# Patient Record
Sex: Male | Born: 1940 | Race: Black or African American | Hispanic: No | Smoking: Light tobacco smoker
Health system: Southern US, Community
[De-identification: ages and names within clinical notes are randomized; demographics above are authoritative.]

## PROBLEM LIST (undated history)

## (undated) DIAGNOSIS — Z9001 Acquired absence of eye: Secondary | ICD-10-CM

## (undated) DIAGNOSIS — E119 Type 2 diabetes mellitus without complications: Secondary | ICD-10-CM

## (undated) DIAGNOSIS — F1010 Alcohol abuse, uncomplicated: Secondary | ICD-10-CM

## (undated) DIAGNOSIS — N289 Disorder of kidney and ureter, unspecified: Secondary | ICD-10-CM

## (undated) DIAGNOSIS — H548 Legal blindness, as defined in USA: Secondary | ICD-10-CM

---

## 1998-11-10 ENCOUNTER — Encounter: Payer: Self-pay | Admitting: Emergency Medicine

## 1998-11-10 ENCOUNTER — Emergency Department (HOSPITAL_COMMUNITY): Admission: EM | Admit: 1998-11-10 | Discharge: 1998-11-10 | Payer: Self-pay | Admitting: Emergency Medicine

## 1999-03-01 ENCOUNTER — Emergency Department (HOSPITAL_COMMUNITY): Admission: EM | Admit: 1999-03-01 | Discharge: 1999-03-01 | Payer: Self-pay | Admitting: Emergency Medicine

## 1999-03-02 ENCOUNTER — Encounter: Payer: Self-pay | Admitting: Emergency Medicine

## 1999-12-24 ENCOUNTER — Emergency Department (HOSPITAL_COMMUNITY): Admission: EM | Admit: 1999-12-24 | Discharge: 1999-12-25 | Payer: Self-pay | Admitting: Emergency Medicine

## 1999-12-24 ENCOUNTER — Encounter: Admission: RE | Admit: 1999-12-24 | Discharge: 1999-12-24 | Payer: Self-pay | Admitting: Internal Medicine

## 2000-03-14 ENCOUNTER — Emergency Department (HOSPITAL_COMMUNITY): Admission: EM | Admit: 2000-03-14 | Discharge: 2000-03-15 | Payer: Self-pay | Admitting: Emergency Medicine

## 2000-03-15 ENCOUNTER — Encounter: Payer: Self-pay | Admitting: Emergency Medicine

## 2000-08-05 ENCOUNTER — Encounter: Payer: Self-pay | Admitting: Emergency Medicine

## 2000-08-05 ENCOUNTER — Emergency Department (HOSPITAL_COMMUNITY): Admission: EM | Admit: 2000-08-05 | Discharge: 2000-08-05 | Payer: Self-pay | Admitting: *Deleted

## 2000-08-14 ENCOUNTER — Emergency Department (HOSPITAL_COMMUNITY): Admission: EM | Admit: 2000-08-14 | Discharge: 2000-08-15 | Payer: Self-pay

## 2000-08-15 ENCOUNTER — Encounter: Payer: Self-pay | Admitting: Emergency Medicine

## 2000-08-16 ENCOUNTER — Encounter: Admission: RE | Admit: 2000-08-16 | Discharge: 2000-08-16 | Payer: Self-pay | Admitting: Internal Medicine

## 2000-08-16 ENCOUNTER — Emergency Department (HOSPITAL_COMMUNITY): Admission: EM | Admit: 2000-08-16 | Discharge: 2000-08-16 | Payer: Self-pay | Admitting: Emergency Medicine

## 2001-03-17 ENCOUNTER — Encounter: Payer: Self-pay | Admitting: *Deleted

## 2001-03-17 ENCOUNTER — Emergency Department (HOSPITAL_COMMUNITY): Admission: EM | Admit: 2001-03-17 | Discharge: 2001-03-17 | Payer: Self-pay | Admitting: *Deleted

## 2003-01-30 ENCOUNTER — Emergency Department (HOSPITAL_COMMUNITY): Admission: EM | Admit: 2003-01-30 | Discharge: 2003-01-31 | Payer: Self-pay | Admitting: Emergency Medicine

## 2003-01-31 ENCOUNTER — Encounter: Payer: Self-pay | Admitting: Emergency Medicine

## 2003-08-19 ENCOUNTER — Emergency Department (HOSPITAL_COMMUNITY): Admission: EM | Admit: 2003-08-19 | Discharge: 2003-08-20 | Payer: Self-pay | Admitting: Emergency Medicine

## 2003-11-06 ENCOUNTER — Emergency Department (HOSPITAL_COMMUNITY): Admission: EM | Admit: 2003-11-06 | Discharge: 2003-11-06 | Payer: Self-pay | Admitting: Emergency Medicine

## 2004-01-05 ENCOUNTER — Emergency Department (HOSPITAL_COMMUNITY): Admission: EM | Admit: 2004-01-05 | Discharge: 2004-01-06 | Payer: Self-pay

## 2004-05-12 ENCOUNTER — Emergency Department (HOSPITAL_COMMUNITY): Admission: EM | Admit: 2004-05-12 | Discharge: 2004-05-13 | Payer: Self-pay

## 2005-02-06 ENCOUNTER — Emergency Department (HOSPITAL_COMMUNITY): Admission: EM | Admit: 2005-02-06 | Discharge: 2005-02-06 | Payer: Self-pay | Admitting: Emergency Medicine

## 2005-04-29 ENCOUNTER — Emergency Department (HOSPITAL_COMMUNITY): Admission: EM | Admit: 2005-04-29 | Discharge: 2005-04-30 | Payer: Self-pay | Admitting: Emergency Medicine

## 2006-07-24 ENCOUNTER — Inpatient Hospital Stay (HOSPITAL_COMMUNITY): Admission: EM | Admit: 2006-07-24 | Discharge: 2006-09-05 | Payer: Self-pay | Admitting: Emergency Medicine

## 2006-08-03 ENCOUNTER — Ambulatory Visit: Payer: Self-pay | Admitting: Vascular Surgery

## 2006-08-21 ENCOUNTER — Ambulatory Visit: Payer: Self-pay | Admitting: Physical Medicine & Rehabilitation

## 2006-12-19 ENCOUNTER — Ambulatory Visit: Payer: Self-pay | Admitting: Internal Medicine

## 2006-12-21 ENCOUNTER — Encounter (INDEPENDENT_AMBULATORY_CARE_PROVIDER_SITE_OTHER): Payer: Self-pay | Admitting: Internal Medicine

## 2007-01-02 ENCOUNTER — Ambulatory Visit: Payer: Self-pay | Admitting: Internal Medicine

## 2007-03-23 ENCOUNTER — Encounter (INDEPENDENT_AMBULATORY_CARE_PROVIDER_SITE_OTHER): Payer: Self-pay | Admitting: Internal Medicine

## 2007-03-23 DIAGNOSIS — I10 Essential (primary) hypertension: Secondary | ICD-10-CM | POA: Insufficient documentation

## 2007-03-23 DIAGNOSIS — E119 Type 2 diabetes mellitus without complications: Secondary | ICD-10-CM | POA: Insufficient documentation

## 2007-03-23 DIAGNOSIS — H4089 Other specified glaucoma: Secondary | ICD-10-CM

## 2007-03-23 DIAGNOSIS — H1089 Other conjunctivitis: Secondary | ICD-10-CM

## 2007-03-23 DIAGNOSIS — K219 Gastro-esophageal reflux disease without esophagitis: Secondary | ICD-10-CM

## 2007-03-23 DIAGNOSIS — R079 Chest pain, unspecified: Secondary | ICD-10-CM

## 2007-03-23 DIAGNOSIS — F1020 Alcohol dependence, uncomplicated: Secondary | ICD-10-CM

## 2007-03-23 DIAGNOSIS — R7309 Other abnormal glucose: Secondary | ICD-10-CM

## 2007-07-27 IMAGING — CR DG CHEST 1V PORT
1 series · 1 of 1 positions shown · non-contrast
Comparison: 08/18/06.

CLINICAL DATA: Contusion.  Follow-up.
 PORTABLE CHEST - 1 VIEW - 08/19/06 AT 1815 HOURS:

[view not recorded]
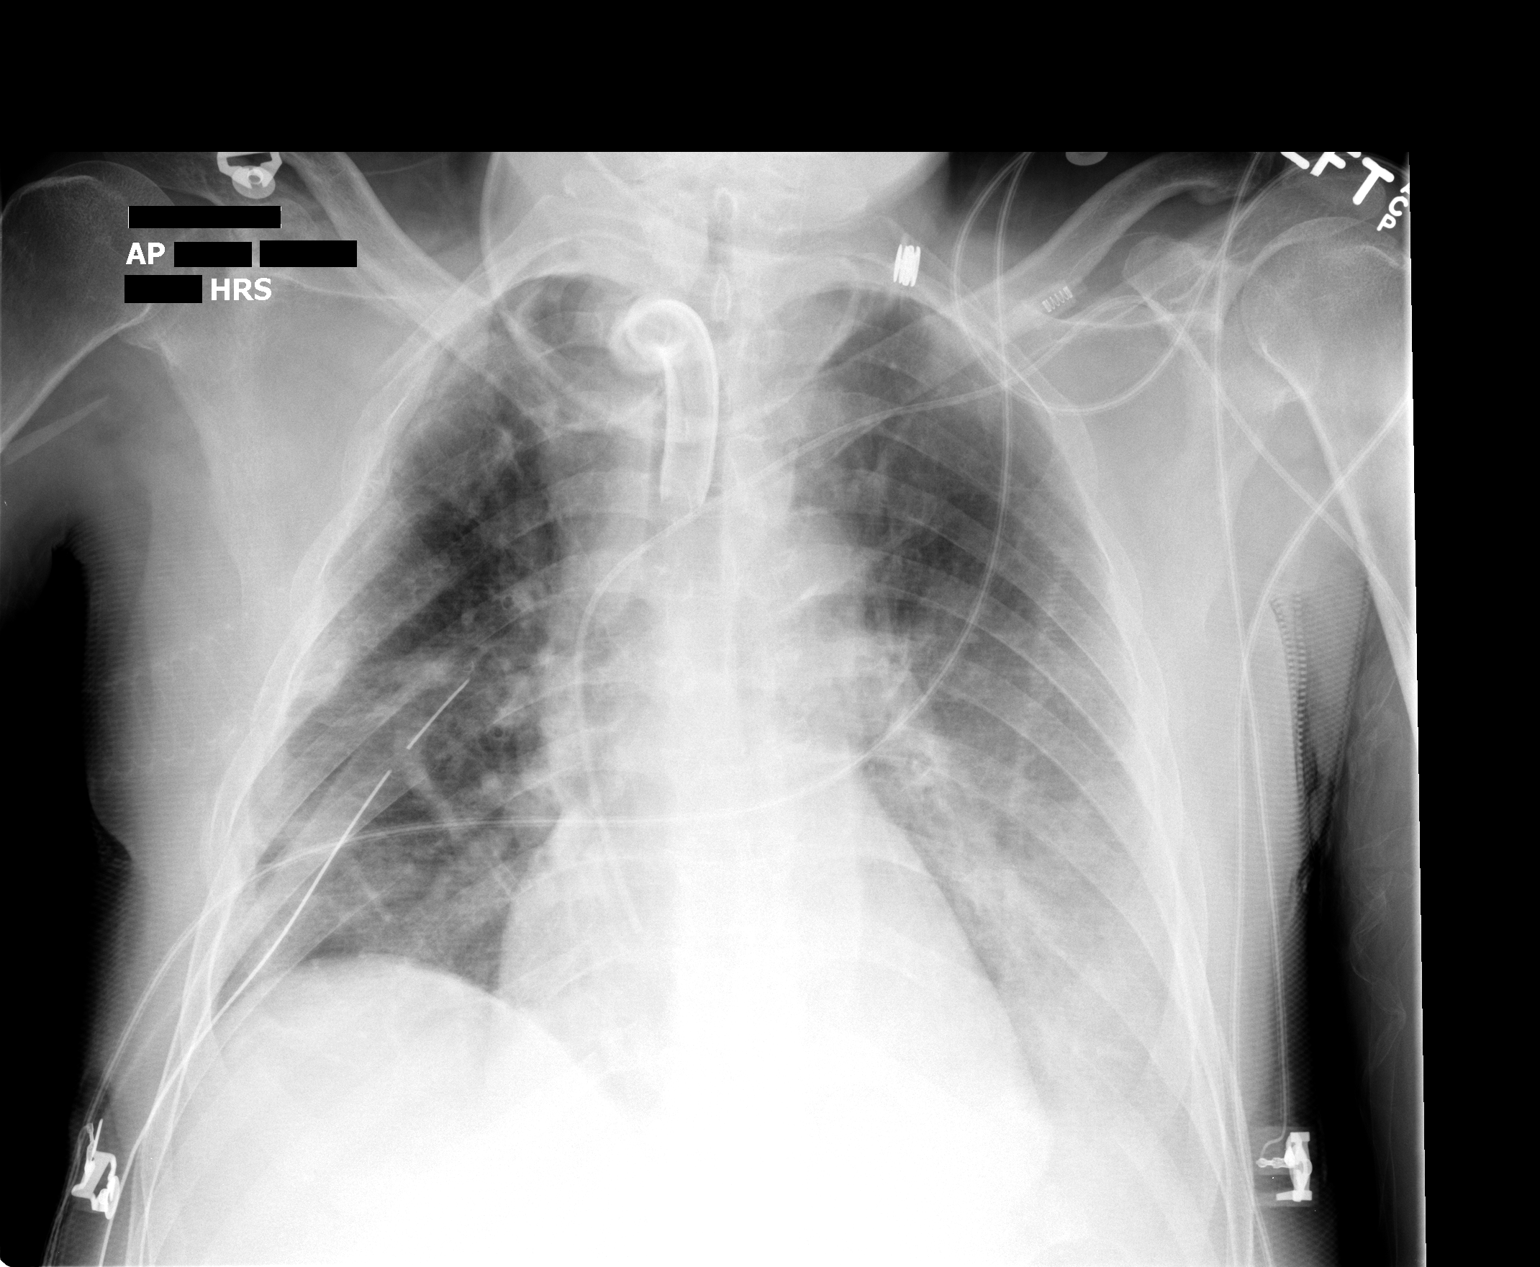

[1 of 1 positions shown; findings below may reference images not displayed]

FINDINGS: There is little change in haziness throughout the lungs, particularly at the left lung base, consistent with atelectasis, effusion, and possibly contusion.  There appear to be bilateral rib fractures.  A right chest tube is present and only a tiny apical pneumothorax is noted.  A tracheostomy tube remains and a left central venous line tip is in the SVC-RA junction.
IMPRESSION: 1.  Right chest tube remains with a tiny right apical pneumothorax.
 2.  No change in air space disease, particularly at the bases, left greater than right.

## 2007-07-28 IMAGING — CR DG CHEST 1V PORT
1 series · 1 of 1 positions shown · non-contrast
Comparison: none

CLINICAL DATA: Contusion.  Follow-up.
 PORTABLE CHEST - 1 VIEW 08/20/06 AT 7666 HOURS:

[view not recorded]
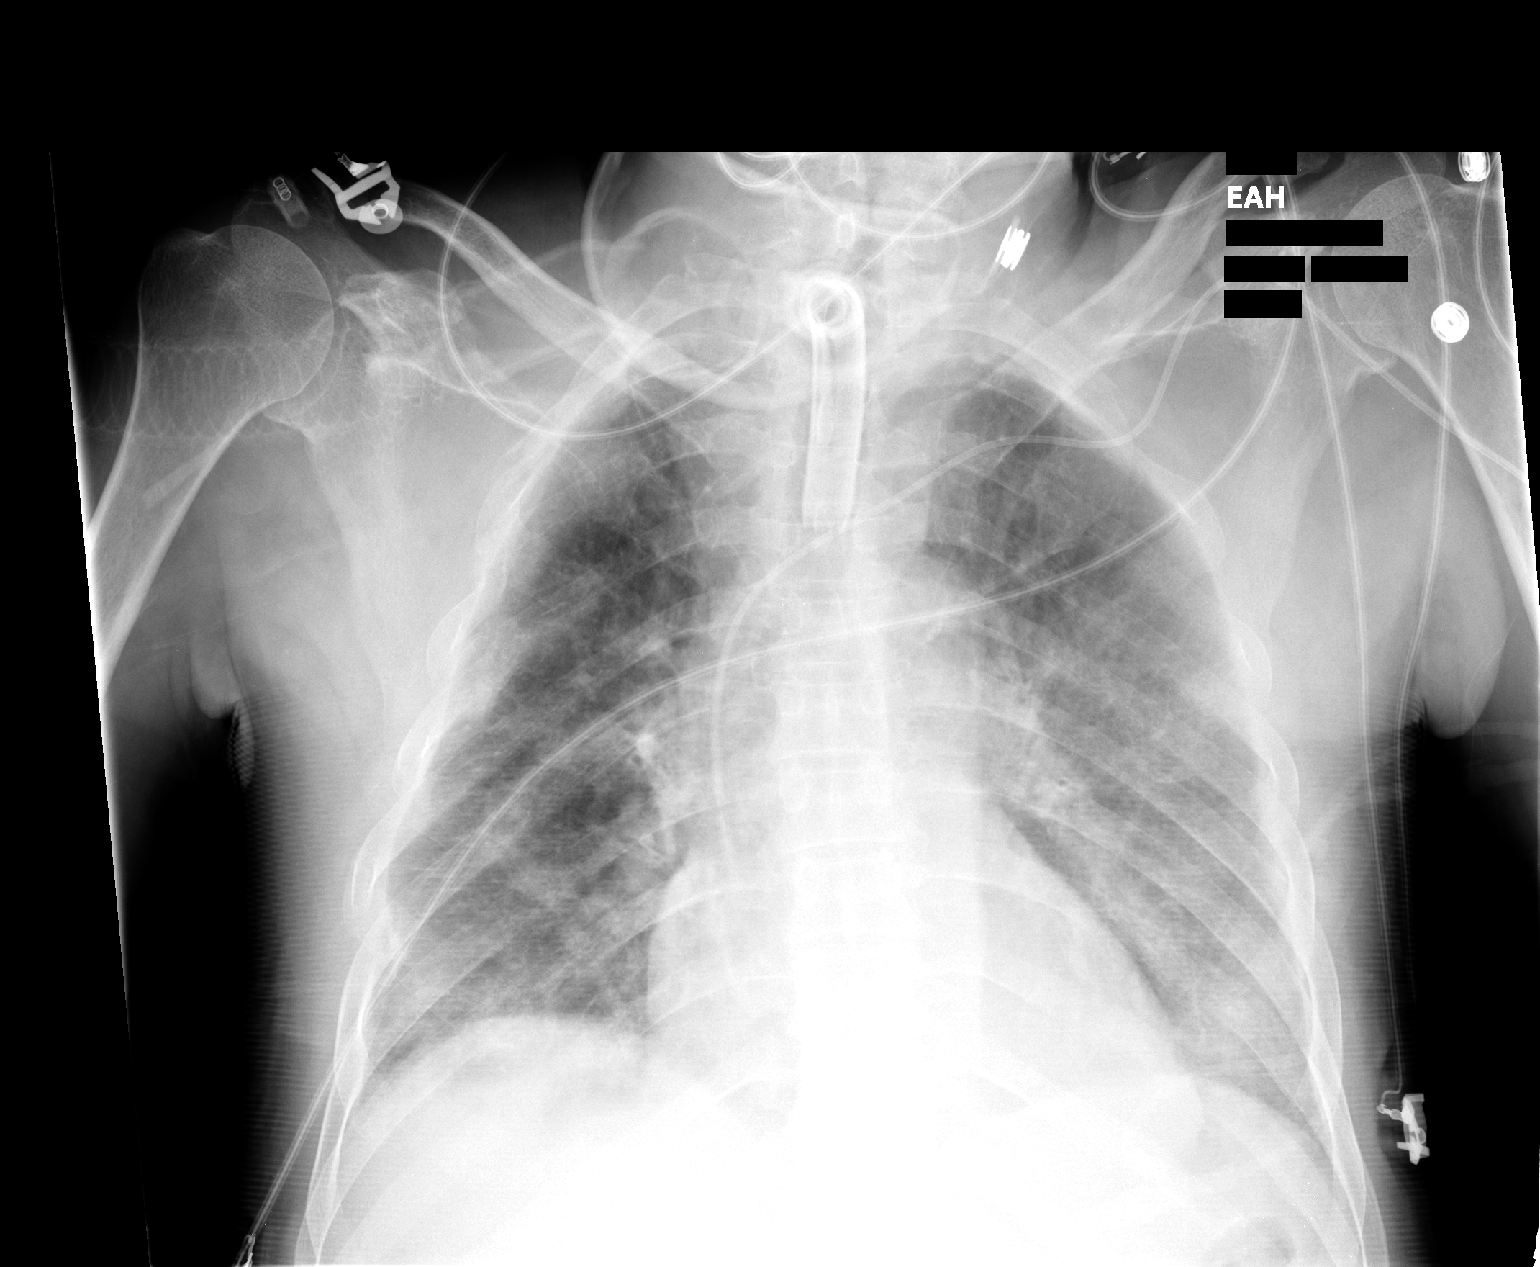

[1 of 1 positions shown; findings below may reference images not displayed]

FINDINGS: The right chest tube has been removed and the tiny right apical pneumothorax noted yesterday is not seen.  Coarse lung markings remain bilaterally.  Tracheostomy and central venous line remain.
IMPRESSION: Right chest tube removed.  No definite pneumothorax.

## 2007-07-31 IMAGING — CR DG CHEST 1V PORT
1 series · 1 of 1 positions shown · non-contrast
Comparison: 08/22/06.

CLINICAL DATA: Head contusion, ankle fracture.
 PORTABLE CHEST ? 1 VIEW ? 08/23/06:

[view not recorded]
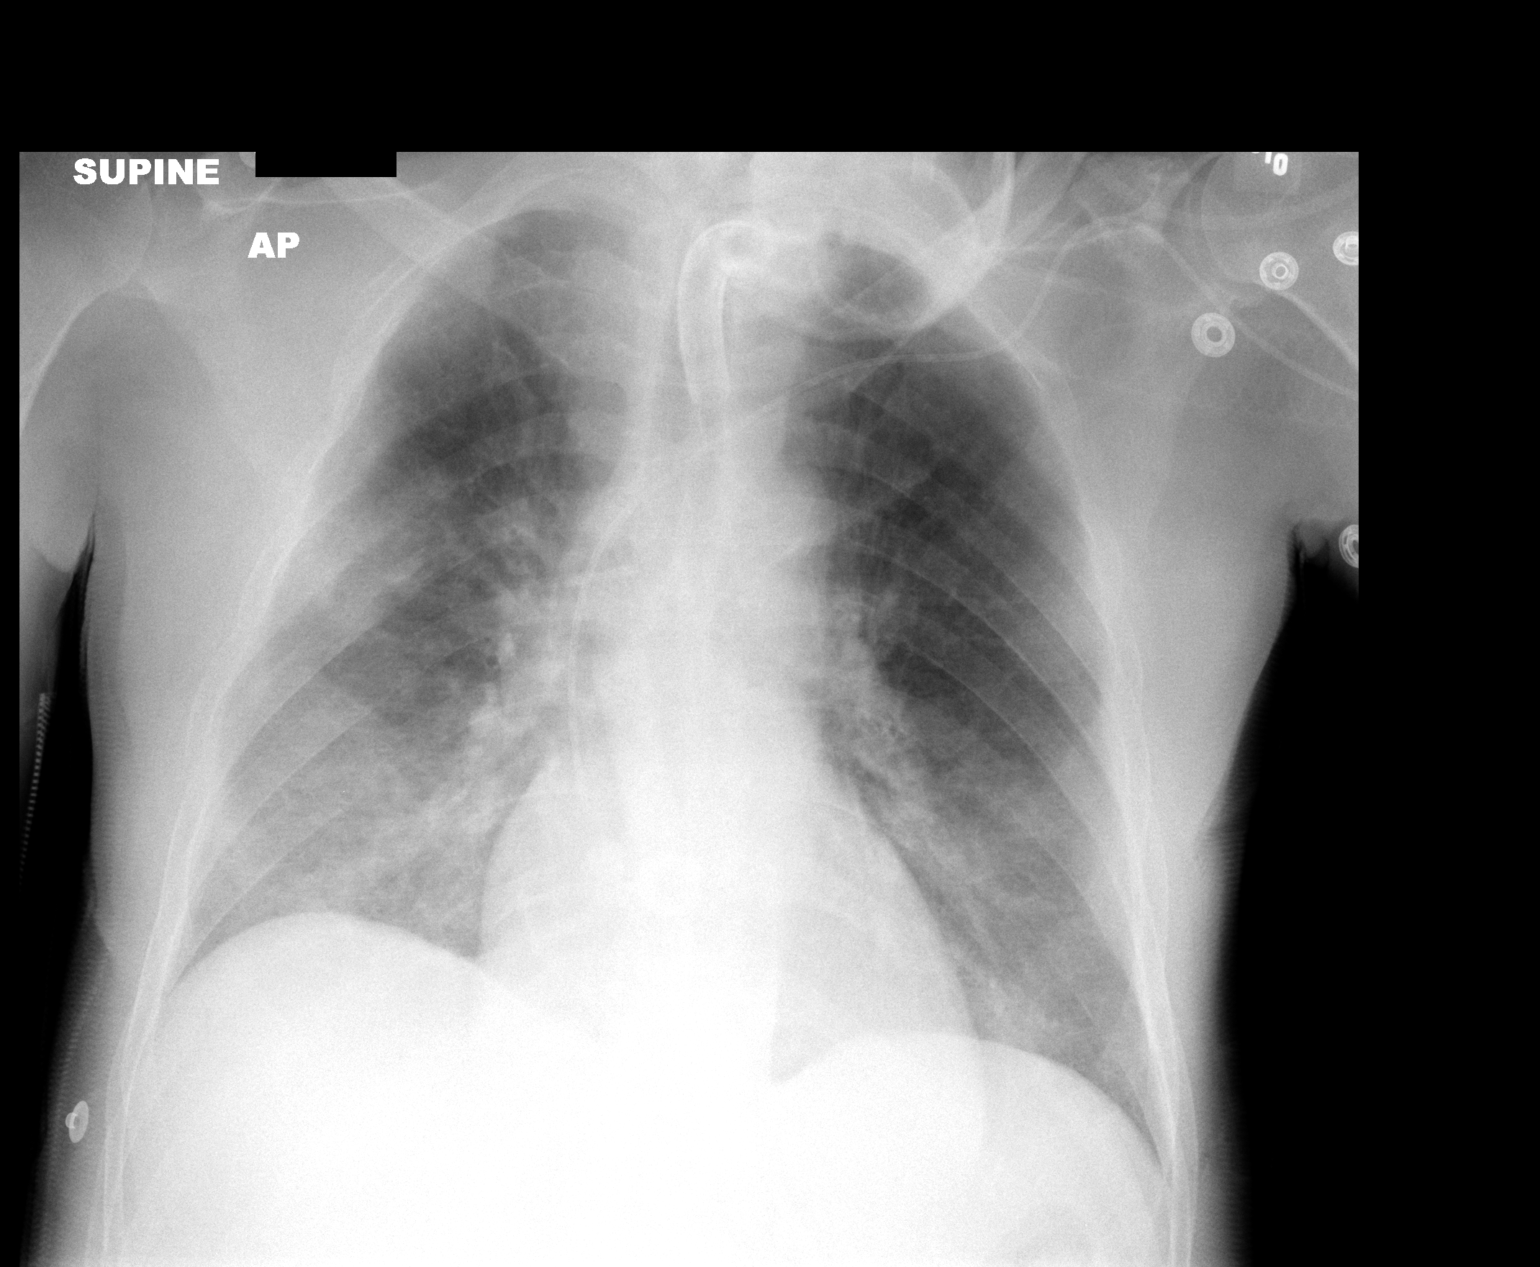

[1 of 1 positions shown; findings below may reference images not displayed]

FINDINGS: Tracheostomy is midline.  Left-sided PICC line terminates at the low SVC.  The heart size is normal.  Suspect COPD.  Costophrenic angles are sharp.  There is no pneumothorax.  Slight improvement in right greater than left patchy airspace opacities.  Mild osteopenia.
IMPRESSION: Improvement in patchy right greater than left airspace opacities most likely related to pulmonary edema.  Multifocal infection or aspiration felt less likely.

## 2007-08-01 IMAGING — CR DG CHEST 1V PORT
1 series · 1 of 1 positions shown · non-contrast
Comparison: 08/23/06.

CLINICAL DATA: Rib fractures.  Worsening cough.  
 PORTABLE CHEST ? 1 VIEW:

[view not recorded]
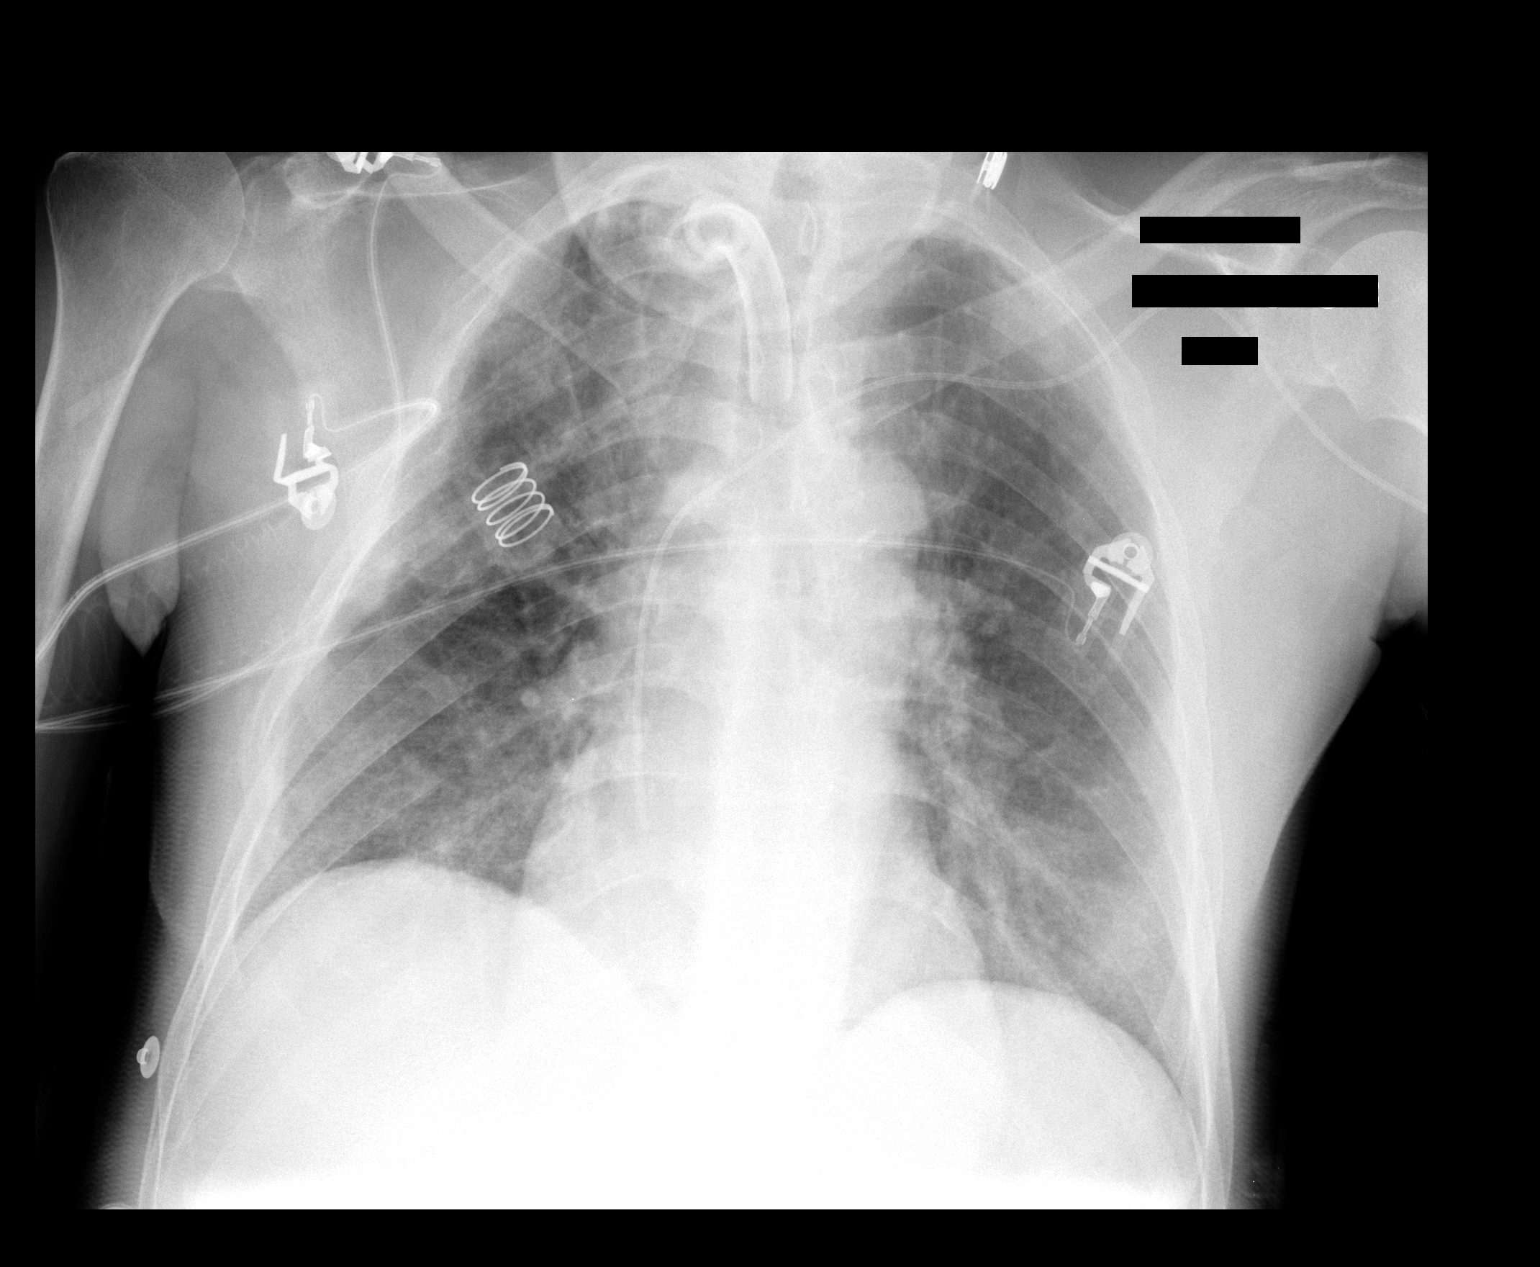

[1 of 1 positions shown; findings below may reference images not displayed]

FINDINGS: Mild patchy pulmonary opacity in the right upper lobe and left lung base has not significantly changed.  No new or worsening areas of pulmonary opacity are seen.  There is no evidence of pleural effusion.  Heart size is normal.  Right rib fracture deformities are again noted but there is no evidence of pneumothorax.  Tracheostomy tube and left arm PICC line remain in appropriate position.
IMPRESSION: No significant change compared with prior study.  No evidence of pneumothorax.
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
## 2009-05-17 ENCOUNTER — Emergency Department (HOSPITAL_COMMUNITY): Admission: EM | Admit: 2009-05-17 | Discharge: 2009-05-17 | Payer: Self-pay | Admitting: Emergency Medicine

## 2009-11-18 ENCOUNTER — Emergency Department (HOSPITAL_COMMUNITY): Admission: EM | Admit: 2009-11-18 | Discharge: 2009-11-19 | Payer: Self-pay | Admitting: Emergency Medicine

## 2010-05-21 ENCOUNTER — Emergency Department (HOSPITAL_COMMUNITY): Admission: EM | Admit: 2010-05-21 | Discharge: 2010-05-21 | Payer: Self-pay | Admitting: Emergency Medicine

## 2010-07-18 ENCOUNTER — Encounter: Payer: Self-pay | Admitting: Neurosurgery

## 2010-09-13 LAB — CBC
MCHC: 33.8 g/dL (ref 30.0–36.0)
Platelets: 214 10*3/uL (ref 150–400)
RBC: 3.89 MIL/uL — ABNORMAL LOW (ref 4.22–5.81)
RDW: 15 % (ref 11.5–15.5)

## 2010-09-13 LAB — RAPID URINE DRUG SCREEN, HOSP PERFORMED
Amphetamines: NOT DETECTED
Benzodiazepines: NOT DETECTED
Opiates: POSITIVE — AB
Tetrahydrocannabinol: NOT DETECTED

## 2010-09-13 LAB — POCT I-STAT, CHEM 8
HCT: 45 % (ref 39.0–52.0)
Hemoglobin: 15.3 g/dL (ref 13.0–17.0)
Potassium: 3.9 mEq/L (ref 3.5–5.1)
Sodium: 138 mEq/L (ref 135–145)

## 2010-09-13 LAB — DIFFERENTIAL
Basophils Absolute: 0.1 10*3/uL (ref 0.0–0.1)
Basophils Relative: 1 % (ref 0–1)
Monocytes Relative: 6 % (ref 3–12)
Neutro Abs: 5.8 10*3/uL (ref 1.7–7.7)
Neutrophils Relative %: 73 % (ref 43–77)
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
## 2010-11-12 NOTE — Op Note (Signed)
[name], [name] NO.:  [id number]   MEDICAL RECORD NO.:  [id number]          PATIENT TYPE:  EMS   LOCATION:  MAJO                         FACILITY:  [facility]   PHYSICIAN:  Earney Hamburg, P.A.  DATE OF BIRTH:  18-Sep-1940   DATE OF PROCEDURE:  07/24/2006  DATE OF DISCHARGE:                               OPERATIVE REPORT   PROCEDURE:  Complex closure of occipital scalp laceration.   INDICATIONS:  Occipital scalp laceration.   SURGEON:  Earney Hamburg, P.A.   ASSISTANT:  None.   ANESTHESIA:  Approximately 5 mL of 2% lidocaine with epinephrine.   COMPLICATIONS:  None.   ESTIMATED BLOOD LOSS:  None.   FINDINGS:  After the patient was anesthetized, the wound was irrigated  with approximately 100 mL of normal saline and scrubbed with Betadine  scrubs.  He was noted to have a couple flaps that looked viable, but  certainly have the potential to necrose.  There seemed to be some mild  tissue defect in the middle of the wound.  He was approximated as best  as able with a sterile stapler, which resulted in an adequate closure  with adequate cosmesis.  The patient tolerated the procedure well.      Earney Hamburg, P.A.     MJ/MEDQ  D:  07/24/2006  T:  07/24/2006  Job:  [id number]
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
## 2010-11-12 NOTE — Op Note (Signed)
[place]  Patient:    [name], [name]                         MRN: [id number] Proc. Date: 08/16/00 Attending:  Chalmers Guest, Montez Hageman., M.D.                           Operative Report  PREOPERATIVE DIAGNOSIS:  Subacute angle closure glaucoma.  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  OPERATION PERFORMED:  Laser iridotomy.  SURGEON: Chalmers Guest, Montez Hageman., M.D.  COMPLICATIONS:  None.  ANESTHESIA:  DESCRIPTION OF PROCEDURE:  The patient was taken to the short stay area where his eye was numbed with topical tetracaine drops.  With his eye in position, the YAG laser was used to deliver 5.5 millijoules of energy with one shot. The location was the superior peripheral portion of the iris of the right eye. After the iridotomy, aqueous could be seen egressing through the open iridotomy passing into the anterior chamber.  The anterior chamber depth, however, was unchanged at the slit lamp immediately following the laser. DD:  08/17/00 TD:  08/17/00 Job: [id number]
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
## 2011-01-04 ENCOUNTER — Emergency Department (HOSPITAL_COMMUNITY): Payer: Medicare Other

## 2011-01-04 ENCOUNTER — Emergency Department (HOSPITAL_COMMUNITY)
Admission: EM | Admit: 2011-01-04 | Discharge: 2011-01-04 | Disposition: A | Discharge status: Home / Self Care | Payer: Medicare Other | Attending: Emergency Medicine | Admitting: Emergency Medicine

## 2011-01-04 DIAGNOSIS — F17200 Nicotine dependence, unspecified, uncomplicated: Secondary | ICD-10-CM | POA: Insufficient documentation

## 2011-01-04 DIAGNOSIS — F1010 Alcohol abuse, uncomplicated: Secondary | ICD-10-CM | POA: Insufficient documentation

## 2011-01-04 DIAGNOSIS — G9389 Other specified disorders of brain: Secondary | ICD-10-CM | POA: Insufficient documentation

## 2011-01-04 DIAGNOSIS — Z9001 Acquired absence of eye: Secondary | ICD-10-CM | POA: Insufficient documentation

## 2011-01-04 DIAGNOSIS — N390 Urinary tract infection, site not specified: Secondary | ICD-10-CM | POA: Insufficient documentation

## 2011-01-04 DIAGNOSIS — R5381 Other malaise: Secondary | ICD-10-CM | POA: Insufficient documentation

## 2011-01-04 DIAGNOSIS — H543 Unqualified visual loss, both eyes: Secondary | ICD-10-CM | POA: Insufficient documentation

## 2011-01-04 DIAGNOSIS — R5383 Other fatigue: Secondary | ICD-10-CM | POA: Insufficient documentation

## 2011-01-04 DIAGNOSIS — R110 Nausea: Secondary | ICD-10-CM | POA: Insufficient documentation

## 2011-01-04 LAB — COMPREHENSIVE METABOLIC PANEL
ALT: 13 U/L (ref 0–53)
Albumin: 3.9 g/dL (ref 3.5–5.2)
Alkaline Phosphatase: 91 U/L (ref 39–117)
BUN: 28 mg/dL — ABNORMAL HIGH (ref 6–23)
Potassium: 4.3 mEq/L (ref 3.5–5.1)
Sodium: 139 mEq/L (ref 135–145)
Total Protein: 8.1 g/dL (ref 6.0–8.3)

## 2011-01-04 LAB — CK TOTAL AND CKMB (NOT AT ARMC)
CK, MB: 1.6 ng/mL (ref 0.3–4.0)
Relative Index: INVALID (ref 0.0–2.5)

## 2011-01-04 LAB — URINE MICROSCOPIC-ADD ON

## 2011-01-04 LAB — CBC
HCT: 40.6 % (ref 39.0–52.0)
Hemoglobin: 13.8 g/dL (ref 13.0–17.0)
WBC: 8.9 10*3/uL (ref 4.0–10.5)

## 2011-01-04 LAB — URINALYSIS, ROUTINE W REFLEX MICROSCOPIC
Glucose, UA: NEGATIVE mg/dL
Specific Gravity, Urine: 1.021 (ref 1.005–1.030)
pH: 5 (ref 5.0–8.0)

## 2011-01-04 LAB — DIFFERENTIAL
Basophils Absolute: 0 10*3/uL (ref 0.0–0.1)
Lymphocytes Relative: 23 % (ref 12–46)
Neutro Abs: 6 10*3/uL (ref 1.7–7.7)
Neutrophils Relative %: 68 % (ref 43–77)

## 2011-01-19 ENCOUNTER — Emergency Department (HOSPITAL_COMMUNITY): Payer: Medicare Other

## 2011-01-19 ENCOUNTER — Inpatient Hospital Stay (HOSPITAL_COMMUNITY)
Admission: EM | Admit: 2011-01-19 | Discharge: 2011-01-21 | DRG: 683 | Disposition: A | Discharge status: Home / Self Care | Payer: Medicare Other | Attending: Internal Medicine | Admitting: Internal Medicine

## 2011-01-19 ENCOUNTER — Inpatient Hospital Stay (HOSPITAL_COMMUNITY): Payer: Medicare Other

## 2011-01-19 ENCOUNTER — Inpatient Hospital Stay: Admission: EM | Admit: 2011-01-19 | Payer: Self-pay | Admitting: *Deleted

## 2011-01-19 DIAGNOSIS — Z7982 Long term (current) use of aspirin: Secondary | ICD-10-CM

## 2011-01-19 DIAGNOSIS — E860 Dehydration: Secondary | ICD-10-CM | POA: Diagnosis present

## 2011-01-19 DIAGNOSIS — E872 Acidosis, unspecified: Secondary | ICD-10-CM | POA: Diagnosis present

## 2011-01-19 DIAGNOSIS — H209 Unspecified iridocyclitis: Secondary | ICD-10-CM | POA: Diagnosis present

## 2011-01-19 DIAGNOSIS — H543 Unqualified visual loss, both eyes: Secondary | ICD-10-CM | POA: Diagnosis present

## 2011-01-19 DIAGNOSIS — Z87891 Personal history of nicotine dependence: Secondary | ICD-10-CM

## 2011-01-19 DIAGNOSIS — E875 Hyperkalemia: Secondary | ICD-10-CM | POA: Diagnosis present

## 2011-01-19 DIAGNOSIS — N179 Acute kidney failure, unspecified: Principal | ICD-10-CM | POA: Diagnosis present

## 2011-01-19 DIAGNOSIS — H409 Unspecified glaucoma: Secondary | ICD-10-CM | POA: Diagnosis present

## 2011-01-19 DIAGNOSIS — I10 Essential (primary) hypertension: Secondary | ICD-10-CM | POA: Diagnosis present

## 2011-01-19 DIAGNOSIS — N390 Urinary tract infection, site not specified: Secondary | ICD-10-CM | POA: Diagnosis present

## 2011-01-19 LAB — URINALYSIS, ROUTINE W REFLEX MICROSCOPIC
Bilirubin Urine: NEGATIVE
Glucose, UA: NEGATIVE mg/dL
Protein, ur: NEGATIVE mg/dL
Urobilinogen, UA: 0.2 mg/dL (ref 0.0–1.0)

## 2011-01-19 LAB — BLOOD GAS, ARTERIAL
Acid-base deficit: 7.2 mmol/L — ABNORMAL HIGH (ref 0.0–2.0)
Bicarbonate: 17.8 mEq/L — ABNORMAL LOW (ref 20.0–24.0)
Patient temperature: 98.6
TCO2: 18.9 mmol/L (ref 0–100)
pH, Arterial: 7.316 — ABNORMAL LOW (ref 7.350–7.450)

## 2011-01-19 LAB — BASIC METABOLIC PANEL
BUN: 37 mg/dL — ABNORMAL HIGH (ref 6–23)
Creatinine, Ser: 2.81 mg/dL — ABNORMAL HIGH (ref 0.50–1.35)
GFR calc Af Amer: 27 mL/min — ABNORMAL LOW (ref >60)
GFR calc non Af Amer: 23 mL/min — ABNORMAL LOW (ref >60)
Glucose, Bld: 100 mg/dL — ABNORMAL HIGH (ref 70–99)

## 2011-01-19 LAB — RAPID URINE DRUG SCREEN, HOSP PERFORMED
Amphetamines: NOT DETECTED
Cocaine: NOT DETECTED
Opiates: NOT DETECTED
Tetrahydrocannabinol: NOT DETECTED

## 2011-01-19 LAB — URINE MICROSCOPIC-ADD ON

## 2011-01-19 LAB — DIFFERENTIAL
Basophils Absolute: 0 10*3/uL (ref 0.0–0.1)
Basophils Relative: 0 % (ref 0–1)
Eosinophils Absolute: 0.2 10*3/uL (ref 0.0–0.7)
Eosinophils Relative: 2 % (ref 0–5)
Lymphocytes Relative: 16 % (ref 12–46)

## 2011-01-19 LAB — CBC
MCHC: 35.2 g/dL (ref 30.0–36.0)
Platelets: 237 10*3/uL (ref 150–400)
RDW: 14.1 % (ref 11.5–15.5)
WBC: 9.5 10*3/uL (ref 4.0–10.5)

## 2011-01-19 LAB — CK TOTAL AND CKMB (NOT AT ARMC)
CK, MB: 2.3 ng/mL (ref 0.3–4.0)
Relative Index: INVALID (ref 0.0–2.5)

## 2011-01-19 LAB — APTT: aPTT: 29 seconds (ref 24–37)

## 2011-01-19 LAB — PROTIME-INR
INR: 0.97 (ref 0.00–1.49)
Prothrombin Time: 13.1 seconds (ref 11.6–15.2)

## 2011-01-20 ENCOUNTER — Other Ambulatory Visit: Payer: Self-pay | Admitting: Internal Medicine

## 2011-01-20 ENCOUNTER — Inpatient Hospital Stay (HOSPITAL_COMMUNITY): Payer: Medicare Other

## 2011-01-20 ENCOUNTER — Inpatient Hospital Stay (HOSPITAL_COMMUNITY): Payer: Medicare Other | Attending: Internal Medicine

## 2011-01-20 DIAGNOSIS — I517 Cardiomegaly: Secondary | ICD-10-CM

## 2011-01-20 DIAGNOSIS — M79609 Pain in unspecified limb: Secondary | ICD-10-CM

## 2011-01-20 LAB — COMPREHENSIVE METABOLIC PANEL
ALT: 18 U/L (ref 0–53)
Albumin: 3.3 g/dL — ABNORMAL LOW (ref 3.5–5.2)
Alkaline Phosphatase: 63 U/L (ref 39–117)
Potassium: 5 mEq/L (ref 3.5–5.1)
Sodium: 139 mEq/L (ref 135–145)
Total Protein: 6.6 g/dL (ref 6.0–8.3)

## 2011-01-20 LAB — URINALYSIS, ROUTINE W REFLEX MICROSCOPIC
Glucose, UA: NEGATIVE mg/dL
Hgb urine dipstick: NEGATIVE
Protein, ur: NEGATIVE mg/dL

## 2011-01-20 LAB — URINE CULTURE: Culture  Setup Time: 201207251742

## 2011-01-20 LAB — URINALYSIS, MICROSCOPIC ONLY
Bilirubin Urine: NEGATIVE
Hgb urine dipstick: NEGATIVE
Ketones, ur: NEGATIVE mg/dL
Nitrite: NEGATIVE
pH: 5 (ref 5.0–8.0)

## 2011-01-20 LAB — LIPID PANEL
Total CHOL/HDL Ratio: 2.8 RATIO
VLDL: 29 mg/dL (ref 0–40)

## 2011-01-20 LAB — URINE MICROSCOPIC-ADD ON

## 2011-01-20 LAB — CBC
HCT: 38.3 % — ABNORMAL LOW (ref 39.0–52.0)
MCV: 102.7 fL — ABNORMAL HIGH (ref 78.0–100.0)
RBC: 3.73 MIL/uL — ABNORMAL LOW (ref 4.22–5.81)
RDW: 13.8 % (ref 11.5–15.5)
WBC: 8.7 10*3/uL (ref 4.0–10.5)

## 2011-01-20 LAB — DIFFERENTIAL
Eosinophils Relative: 4 % (ref 0–5)
Lymphocytes Relative: 26 % (ref 12–46)
Lymphs Abs: 2.2 10*3/uL (ref 0.7–4.0)
Monocytes Relative: 8 % (ref 3–12)

## 2011-01-20 LAB — CARDIAC PANEL(CRET KIN+CKTOT+MB+TROPI)
CK, MB: 2.4 ng/mL (ref 0.3–4.0)
Troponin I: <0.3 ng/mL (ref <0.30)
Troponin I: <0.3 ng/mL (ref <0.30)

## 2011-01-20 LAB — LIPASE, BLOOD: Lipase: 32 U/L (ref 11–59)

## 2011-01-20 LAB — PTH, INTACT AND CALCIUM: Calcium, Total (PTH): 10.1 mg/dL (ref 8.4–10.5)

## 2011-01-20 LAB — MAGNESIUM: Magnesium: 1.5 mg/dL (ref 1.5–2.5)

## 2011-01-20 LAB — SODIUM, URINE, RANDOM: Sodium, Ur: 111 mEq/L

## 2011-01-20 MED ORDER — XENON XE 133 GAS
10.0000 | GAS_FOR_INHALATION | Freq: Once | RESPIRATORY_TRACT | Status: AC | PRN
  Administered 2011-01-20: 10 via RESPIRATORY_TRACT

## 2011-01-20 MED ORDER — TECHNETIUM TO 99M ALBUMIN AGGREGATED
6.0000 | Freq: Once | INTRAVENOUS | Status: AC | PRN
  Administered 2011-01-20: 6 via INTRAVENOUS

## 2011-01-21 LAB — BASIC METABOLIC PANEL
CO2: 18 mEq/L — ABNORMAL LOW (ref 19–32)
Chloride: 112 mEq/L (ref 96–112)
Creatinine, Ser: 1.35 mg/dL (ref 0.50–1.35)
GFR calc Af Amer: >60 mL/min (ref >60)
Potassium: 4.1 mEq/L (ref 3.5–5.1)

## 2011-01-21 LAB — UIFE/LIGHT CHAINS/TP QN, 24-HR UR
Albumin, U: DETECTED
Alpha 1, Urine: DETECTED — AB
Free Kappa/Lambda Ratio: 6.03 ratio (ref 2.04–10.37)
Free Lambda Lt Chains,Ur: 11 mg/dL — ABNORMAL HIGH (ref 0.02–0.67)
Gamma Globulin, Urine: DETECTED — AB
Total Protein, Urine: 80.5 mg/dL

## 2011-01-21 LAB — PROTEIN ELECTROPHORESIS, SERUM
Albumin ELP: 52.7 % — ABNORMAL LOW (ref 55.8–66.1)
Alpha-1-Globulin: 4.5 % (ref 2.9–4.9)
Alpha-2-Globulin: 11.7 % (ref 7.1–11.8)
Beta Globulin: 6.4 % (ref 4.7–7.2)
M-Spike, %: NOT DETECTED g/dL
Total Protein ELP: 6.5 g/dL (ref 6.0–8.3)

## 2011-01-21 LAB — URINE CULTURE: Culture  Setup Time: 201207261958

## 2011-01-22 LAB — VITAMIN D 1,25 DIHYDROXY
Vitamin D 1, 25 (OH)2 Total: 24 pg/mL (ref 18–72)
Vitamin D2 1, 25 (OH)2: <8 pg/mL

## 2011-01-24 LAB — UIFE/LIGHT CHAINS/TP QN, 24-HR UR
Albumin, U: DETECTED
Beta, Urine: DETECTED — AB
Free Kappa Lt Chains,Ur: 74 mg/dL — ABNORMAL HIGH (ref 0.14–2.42)
Free Lambda Lt Chains,Ur: 4.74 mg/dL — ABNORMAL HIGH (ref 0.02–0.67)
Gamma Globulin, Urine: DETECTED — AB

## 2011-01-24 LAB — PROTEIN ELECTROPH W RFLX QUANT IMMUNOGLOBULINS
Albumin ELP: 55.2 % — ABNORMAL LOW (ref 55.8–66.1)
Beta Globulin: 6.1 % (ref 4.7–7.2)
Total Protein ELP: 6.5 g/dL (ref 6.0–8.3)

## 2011-01-24 NOTE — Discharge Summary (Signed)
NAMEWYLEY, HACK NO.:  [id number]  MEDICAL RECORD NO.:  [id number]  LOCATION:  5501                         FACILITY:  [facility]  PHYSICIAN:  Lonia Blood, M.D.       DATE OF BIRTH:  07/18/1940  DATE OF ADMISSION:  01/19/2011 DATE OF DISCHARGE:  01/21/2011                              DISCHARGE SUMMARY   PRIMARY CARE PHYSICIAN:  Dr. Candi Leash at [address], [address], [place], [place] [address].  DISCHARGE DIAGNOSES: 1. Acute renal failure. 2. Hyperkalemia. 3. Abnormal urinalysis with negative urine culture. 4. Dehydration. 5. Near syncope. 6. Accelerated hypertension. 7. Hypercalcemia. 8. History of alcohol and tobacco abuse.  Other comorbidities that were quiet during this admission include: 1. Blindness in both eyes. 2. Glaucoma. 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  CONDITION AT TIME OF DISCHARGE:  The patient is alert and oriented.  He is able to eat and drink without difficulty.  He is urinating and having good bowel movements.  He reports he is ready for discharge to home.  HISTORY AND BRIEF HOSPITAL COURSE:  Mr. Dupree is a 70 year old gentleman with a history of blindness and hypertension.  On admission, he reported that his legs have suddenly given way and he had a presyncopal episode, but did not lose consciousness.  He is brought to the emergency department, had an EKG that was showing some ST changes in V1 and V2 and was taken straight to the Cardiac Cath Lab.  In the Cardiac Cath Lab, the patient was found not to have any chest pain.  The cardiologist reviewed the EKG and found it was nonspecific.  He was sent back to the ER for further evaluation.  [facility] was called for admission. The patient reported that he had been hospitalized 2 weeks prior and treated for urinary tract infection at [facility].  He was not sure his urinary tract infection had cleared.  He was found in the emergency  department to have acute renal failure and accelerated hypertension.  He was admitted to the [facility] on a telemetry bed.    For his acute renal failure and hypercalcemia, he was aggressively hydrated with IV fluids and intact parathormone level, phosphorus and vitamin D levels were checked as well as serum protein electrophoresis and urine protein electrophoresis, urine sodium, creatinine, and eosinophils.  Also, a renal ultrasound was ordered.  Because of his near syncope, a 2-D echo as well as TSH and D-dimer were checked.  His cardiac enzymes were cycled and found to be within normal limits.  It was felt that his near syncope was most probably from dehydration.    In regards to his abnormal urinalysis, the patient's UA on admission showed large leukocytes and white blood cells that were too numerous to count.  Urine cultures, however, grew out only 20,000 colonies of multiple bacterial morphotypes.  The patient was started on IV Cipro.  He will continue with 5 days of p.o. Cipro as an outpatient.  The patient had an elevated D-dimer of 2.46.  Because of this, he had bilateral lower extremity Dopplers.  No evidence of DVT  or Baker cyst was found.  He also had a V/Q pulmonary ventilation-perfusion lung scan that was completed on January 20, 2011, that showed low probability of pulmonary embolus.  The patient is currently not having any signs of difficulty breathing.  No respiratory distress.  Acute renal failure.  He was noted to have a creatinine of 2.81, BUN 37 on admission with IV hydration and holding his ACE inhibitor and hydrochlorothiazide.  The patient's BUN is now 19.  His creatinine is 1.35 on January 21, 2011.  We will discharge him without his ACE inhibitor or hydrochlorothiazide.  However, it is believed that in followup with his primary care physician the ACE inhibitor can likely be reinstituted. Further because of his acute renal failure, he had an  ultrasound of his kidneys that showed they were normal in size.  They had increased echogenicity of the renal parenchyma with decrease in differentiation between cortex and medulla consistent with medical renal disease.  He was found to have an enlarged prostate gland and incidentally some sludge in his gallbladder.  Hypercalcemia.  On admission, the patient had a calcium level of 10.9. At the time of discharge, after IV hydration, his calcium level is 10. His intact parathyroid hormone level is 134.1.  His total calcium level is 10.1.  He will need followup as an outpatient for his elevated serum calcium and elevated parathyroid hormone levels.  With regards to his history of alcohol and tobacco abuse, the patient has been counseled to stop drinking and using tobacco.  LABORATORY DATA ON THE DAY OF DISCHARGE:  The patient's sodium was 139, potassium 4.1, chloride 112, bicarb 18, glucose 89, BUN 19, creatinine 1.35.  Calcium level 10.0.  PERTINENT RADIOLOGICAL EXAMS:  On January 20, 2011, the patient had a V/Q scan that showed low probability of pulmonary embolus.  On January 20, 2011, the patient had a renal ultrasound.  Results were listed above in history of brief hospital course.  On January 20, 2011, the patient had a chest x-ray that showed no evidence of acute cardiac or pulmonary process.  On January 19, 2011, the patient had a CT of his head secondary to his presyncope that showed atrophy and chronic changes, no acute abnormality.  On January 20, 2011, the patient had a 2-D echocardiogram that showed his left ventricle had mild LVH.  Systolic function was normal with an EF of 55-60%.  Regional wall motion abnormalities could not be excluded.  He has grade 1 diastolic dysfunction.  Mitral valve had calcified annulus and mildly thickened leaflets.  Otherwise, there was no valvular abnormality noted.  DISCHARGE MEDICATIONS: 1. Amlodipine 10 mg by mouth daily. 2. Aspirin 325 mg, Ecotrin 1  tablet by mouth daily. 3. Cipro 500 mg 1 tablet twice daily by mouth. 4. Multivitamin 1 tablet daily.  PHYSICAL EXAMINATION AT THE TIME OF DISCHARGE:  GENERAL:  The patient is alert and oriented.  His demeanor is pleasant and cooperative.  He is blind. VITAL SIGNS:  Temperature 98.0, pulse 59, respirations 18, and blood pressure 184/87.  For the previous 24 hours, his blood pressure ranged with a systolic between 130-865 and diastolic between 78-46.  O2 sats 100% on room air. HEENT:  Head is atraumatic and normocephalic.  Eyes are closed.  Nose shows no nasal discharge or exterior lesions.  Mouth has moist mucous membranes. NECK:  Supple with midline trachea.  No JVD.  No lymphadenopathy. CHEST:  No accessory muscle use.  He has no wheezes or crackles to  my exam. HEART:  Regular rate and rhythm without obvious murmurs, rubs, or gallops. ABDOMEN:  Slightly distended, nontender with bowel sounds. EXTREMITIES:  No clubbing, cyanosis, or edema. PSYCHIATRIC:  The patient is alert and oriented.  Demeanor is pleasant and cooperative.  Grooming is moderate.  DISCHARGE INSTRUCTIONS:  He will be discharged to home with his girlfriend.  He is to increase his activity level slowly.  DIET:  Low sodium, heart healthy.  FOLLOWUP:  He is to follow up with Dr. Madilyn Fireman in [place], his family practitioner on Wednesday, January 26, 2011, at 10 o'clock.  SUGGESTIONS FOR OUTPATIENT FOLLOWUP:  The patient will be discharged to home on 5 days of Cipro for urinary tract infection.  Also, his ACE inhibitor and hydrochlorothiazide were recently discontinued in the hospital.  His blood pressure needs to be checked and his ACE may need to be reinstated.  Finally, the patient was noted to have abnormally high calcium and parathyroid hormone levels.     Stephani Police, PA   ______________________________ Lonia Blood, M.D.    MLY/MEDQ  D:  01/21/2011  T:  01/21/2011  Job:  [id number]  cc:   Lynelle Doctor. Madilyn Fireman, MD  Electronically Signed by Algis Downs PA on 01/24/2011 04:45:01 PM Electronically Signed by Lonia Blood M.D. on 01/24/2011 05:37:36 PM

## 2011-01-31 NOTE — ED Notes (Signed)
[facility] sent Patient distributions based on ratios of PTH-Intact to Total Calcium and Nurse tried to call Dr. Candi Leash office to fax results over but could not get in touch with anyone. Phone called [phone]

## 2011-02-28 ENCOUNTER — Emergency Department (HOSPITAL_COMMUNITY): Payer: Medicare Other

## 2011-02-28 ENCOUNTER — Emergency Department (HOSPITAL_COMMUNITY)
Admission: EM | Admit: 2011-02-28 | Discharge: 2011-02-28 | Disposition: A | Discharge status: Home / Self Care | Payer: Medicare Other | Attending: Emergency Medicine | Admitting: Emergency Medicine

## 2011-02-28 DIAGNOSIS — H543 Unqualified visual loss, both eyes: Secondary | ICD-10-CM | POA: Insufficient documentation

## 2011-02-28 DIAGNOSIS — W010XXA Fall on same level from slipping, tripping and stumbling without subsequent striking against object, initial encounter: Secondary | ICD-10-CM | POA: Insufficient documentation

## 2011-02-28 DIAGNOSIS — Y92009 Unspecified place in unspecified non-institutional (private) residence as the place of occurrence of the external cause: Secondary | ICD-10-CM | POA: Insufficient documentation

## 2011-02-28 DIAGNOSIS — R51 Headache: Secondary | ICD-10-CM | POA: Insufficient documentation

## 2011-02-28 DIAGNOSIS — IMO0002 Reserved for concepts with insufficient information to code with codable children: Secondary | ICD-10-CM | POA: Insufficient documentation
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
## 2012-05-14 ENCOUNTER — Emergency Department (HOSPITAL_COMMUNITY)
Admission: EM | Admit: 2012-05-14 | Discharge: 2012-05-14 | Disposition: A | Discharge status: Home / Self Care | Payer: Medicare Other | Attending: Emergency Medicine | Admitting: Emergency Medicine

## 2012-05-14 ENCOUNTER — Encounter (HOSPITAL_COMMUNITY): Payer: Self-pay | Admitting: *Deleted

## 2012-05-14 DIAGNOSIS — F17200 Nicotine dependence, unspecified, uncomplicated: Secondary | ICD-10-CM | POA: Insufficient documentation

## 2012-05-14 DIAGNOSIS — N289 Disorder of kidney and ureter, unspecified: Secondary | ICD-10-CM | POA: Insufficient documentation

## 2012-05-14 DIAGNOSIS — F1020 Alcohol dependence, uncomplicated: Secondary | ICD-10-CM | POA: Insufficient documentation

## 2012-05-14 DIAGNOSIS — H548 Legal blindness, as defined in USA: Secondary | ICD-10-CM | POA: Insufficient documentation

## 2012-05-14 DIAGNOSIS — F10229 Alcohol dependence with intoxication, unspecified: Secondary | ICD-10-CM | POA: Insufficient documentation

## 2012-05-14 DIAGNOSIS — F10929 Alcohol use, unspecified with intoxication, unspecified: Secondary | ICD-10-CM

## 2012-05-14 HISTORY — DX: Legal blindness, as defined in USA: H54.8

## 2012-05-14 HISTORY — DX: Disorder of kidney and ureter, unspecified: N28.9

## 2012-05-14 HISTORY — DX: Alcohol abuse, uncomplicated: F10.10

## 2012-05-14 LAB — CBC WITH DIFFERENTIAL/PLATELET
Hemoglobin: 13.8 g/dL (ref 13.0–17.0)
Lymphocytes Relative: 24 % (ref 12–46)
Lymphs Abs: 1.6 10*3/uL (ref 0.7–4.0)
MCH: 35.4 pg — ABNORMAL HIGH (ref 26.0–34.0)
Monocytes Relative: 7 % (ref 3–12)
Neutro Abs: 4.6 10*3/uL (ref 1.7–7.7)
Neutrophils Relative %: 68 % (ref 43–77)
RBC: 3.9 MIL/uL — ABNORMAL LOW (ref 4.22–5.81)
WBC: 6.8 10*3/uL (ref 4.0–10.5)

## 2012-05-14 LAB — COMPREHENSIVE METABOLIC PANEL
ALT: 12 U/L (ref 0–53)
Alkaline Phosphatase: 78 U/L (ref 39–117)
BUN: 18 mg/dL (ref 6–23)
CO2: 24 mEq/L (ref 19–32)
Chloride: 105 mEq/L (ref 96–112)
GFR calc Af Amer: 62 mL/min — ABNORMAL LOW (ref >90)
Glucose, Bld: 81 mg/dL (ref 70–99)
Potassium: 3.8 mEq/L (ref 3.5–5.1)
Sodium: 140 mEq/L (ref 135–145)
Total Bilirubin: 0.3 mg/dL (ref 0.3–1.2)

## 2012-05-14 LAB — ETHANOL: Alcohol, Ethyl (B): 283 mg/dL — ABNORMAL HIGH (ref 0–11)

## 2012-05-14 MED ORDER — SODIUM CHLORIDE 0.9 % IV SOLN
INTRAVENOUS | Status: DC
  Administered 2012-05-14: 17:00:00 via INTRAVENOUS

## 2012-05-14 MED ORDER — THIAMINE HCL 100 MG/ML IJ SOLN
100.0000 mg | Freq: Once | INTRAMUSCULAR | Status: AC
  Administered 2012-05-14: 100 mg via INTRAVENOUS
  Filled 2012-05-14: qty 2

## 2012-05-14 NOTE — ED Notes (Signed)
Await [facility] arrival for pt transport back home

## 2012-05-14 NOTE — ED Provider Notes (Addendum)
History     CSN: [id number] Arrival date & time 05/14/12  1443 First MD Initiated Contact with Patient 05/14/12 1556     Chief Complaint  Patient presents with  . Weakness   HPI The patient was found outside lying in someone's yard. EMS was called and he was brought to the hospital. Patient denies any specific complaints. He's not really sure why he is here. He does have a history of chronic alcohol abuse. He does admit to drinking today. He denies any weakness, pain and denies any recent illness or injuries.  Patient denies any specific complaints and is not sure why he is really here.   Past Medical History  Diagnosis Date  . Renal insufficiency     EMS reports "per familiy member "kidney problem"  . Legally blind     per ems  . Chronic alcohol abuse     History reviewed. No pertinent past surgical history.  No family history on file.  History  Substance Use Topics  . Smoking status: Current Every Day Smoker    Types: Cigarettes  . Smokeless tobacco: Not on file  . Alcohol Use: Yes      Review of Systems  All other systems reviewed and are negative.    Allergies  Review of patient's allergies indicates no known allergies.  Home Medications  No current outpatient prescriptions on file.  BP 103/41  Pulse 84  Temp 98.3 F (36.8 C) (Oral)  Resp 16  SpO2 98%  Physical Exam  Nursing note and vitals reviewed. Constitutional: He is oriented to person, place, and time. No distress.       Disheveled, smells of urine  HENT:  Head: Normocephalic and atraumatic.  Right Ear: External ear normal.  Left Ear: External ear normal.  Eyes: Conjunctivae normal are normal. Right eye exhibits no discharge. Left eye exhibits no discharge. No scleral icterus.  Neck: Neck supple. No tracheal deviation present.  Cardiovascular: Normal rate, regular rhythm and intact distal pulses.   Pulmonary/Chest: Effort normal and breath sounds normal. No stridor. No respiratory distress.  He has no wheezes. He has no rales.  Abdominal: Soft. Bowel sounds are normal. He exhibits no distension. There is no tenderness. There is no rebound and no guarding.  Musculoskeletal: He exhibits no edema and no tenderness.  Neurological: He is alert and oriented to person, place, and time. He has normal strength. No sensory deficit. Cranial nerve deficit:  no gross defecits noted. He exhibits normal muscle tone. He displays no seizure activity. Coordination normal.  Skin: Skin is warm and dry. No rash noted.  Psychiatric: He has a normal mood and affect.    ED Course  Procedures (including critical care time)  Labs Reviewed  CBC WITH DIFFERENTIAL - Abnormal; Notable for the following:    RBC 3.90 (*)     MCV 101.8 (*)     MCH 35.4 (*)     All other components within normal limits  COMPREHENSIVE METABOLIC PANEL - Abnormal; Notable for the following:    GFR calc non Af Amer 54 (*)     GFR calc Af Amer 62 (*)     All other components within normal limits  ETHANOL - Abnormal; Notable for the following:    Alcohol, Ethyl (B) 283 (*)     All other components within normal limits   No results found.   1. Alcohol intoxication       MDM  Pt without acute complaints.  It appears that  he is intoxicated and went to sleep in someone's yard.  Pt allowed to sober up in the ED.  Counseled patient to cut down on his alcohol use.  Will provide with outpatient resources.    6:12 PM Pt is alert, eating dinner.  No complaints.  Stable for discharge  Celene Kras, MD 05/14/12 [phone]

## 2012-05-14 NOTE — ED Notes (Signed)
Pt denies any problems.  Pt was found laying in someones yard.  Pt appeared generally weak, denies any injury, illness or complaint.  Per ems pt has a chronic alcohol abuse issue (this was told to ems).  Pt was alert and oriented for ems, per ems intoxicated, no focal weakness, no facial drift, no arm drift.

## 2012-05-14 NOTE — ED Notes (Signed)
No focal weakness, no arm drift, speech consistent with etoh intoxication

## 2014-05-19 ENCOUNTER — Encounter (HOSPITAL_COMMUNITY): Payer: Self-pay | Admitting: Emergency Medicine

## 2014-05-19 ENCOUNTER — Emergency Department (HOSPITAL_COMMUNITY)
Admission: EM | Admit: 2014-05-19 | Discharge: 2014-05-19 | Disposition: A | Discharge status: Home / Self Care | Payer: Medicare Other | Attending: Emergency Medicine | Admitting: Emergency Medicine

## 2014-05-19 DIAGNOSIS — H548 Legal blindness, as defined in USA: Secondary | ICD-10-CM | POA: Insufficient documentation

## 2014-05-19 DIAGNOSIS — I872 Venous insufficiency (chronic) (peripheral): Secondary | ICD-10-CM | POA: Insufficient documentation

## 2014-05-19 DIAGNOSIS — Z87448 Personal history of other diseases of urinary system: Secondary | ICD-10-CM | POA: Diagnosis not present

## 2014-05-19 DIAGNOSIS — I10 Essential (primary) hypertension: Secondary | ICD-10-CM

## 2014-05-19 DIAGNOSIS — Z720 Tobacco use: Secondary | ICD-10-CM | POA: Insufficient documentation

## 2014-05-19 DIAGNOSIS — L03115 Cellulitis of right lower limb: Secondary | ICD-10-CM | POA: Diagnosis present

## 2014-05-19 HISTORY — DX: Acquired absence of eye: Z90.01

## 2014-05-19 LAB — BASIC METABOLIC PANEL
Anion gap: 12 (ref 5–15)
BUN: 27 mg/dL — ABNORMAL HIGH (ref 6–23)
CO2: 24 mEq/L (ref 19–32)
Calcium: 11.2 mg/dL — ABNORMAL HIGH (ref 8.4–10.5)
Chloride: 102 mEq/L (ref 96–112)
Creatinine, Ser: 1.27 mg/dL (ref 0.50–1.35)
GFR calc Af Amer: 64 mL/min — ABNORMAL LOW (ref >90)
GFR calc non Af Amer: 55 mL/min — ABNORMAL LOW (ref >90)
Glucose, Bld: 99 mg/dL (ref 70–99)
Potassium: 5 mEq/L (ref 3.7–5.3)
Sodium: 138 mEq/L (ref 137–147)

## 2014-05-19 LAB — URINALYSIS, ROUTINE W REFLEX MICROSCOPIC
Bilirubin Urine: NEGATIVE
Glucose, UA: NEGATIVE mg/dL
Hgb urine dipstick: NEGATIVE
Ketones, ur: NEGATIVE mg/dL
Nitrite: NEGATIVE
Protein, ur: NEGATIVE mg/dL
Specific Gravity, Urine: 1.009 (ref 1.005–1.030)
Urobilinogen, UA: 0.2 mg/dL (ref 0.0–1.0)
pH: 6.5 (ref 5.0–8.0)

## 2014-05-19 LAB — CBC WITH DIFFERENTIAL/PLATELET
Basophils Absolute: 0 10*3/uL (ref 0.0–0.1)
Basophils Relative: 1 % (ref 0–1)
Eosinophils Absolute: 0.1 10*3/uL (ref 0.0–0.7)
Eosinophils Relative: 2 % (ref 0–5)
HCT: 40.8 % (ref 39.0–52.0)
Hemoglobin: 13.8 g/dL (ref 13.0–17.0)
Lymphocytes Relative: 16 % (ref 12–46)
Lymphs Abs: 1.2 10*3/uL (ref 0.7–4.0)
MCH: 31.9 pg (ref 26.0–34.0)
MCHC: 33.8 g/dL (ref 30.0–36.0)
MCV: 94.2 fL (ref 78.0–100.0)
Monocytes Absolute: 0.5 10*3/uL (ref 0.1–1.0)
Monocytes Relative: 7 % (ref 3–12)
Neutro Abs: 5.6 10*3/uL (ref 1.7–7.7)
Neutrophils Relative %: 74 % (ref 43–77)
Platelets: 205 10*3/uL (ref 150–400)
RBC: 4.33 MIL/uL (ref 4.22–5.81)
RDW: 13.1 % (ref 11.5–15.5)
WBC: 7.6 10*3/uL (ref 4.0–10.5)

## 2014-05-19 LAB — URINE MICROSCOPIC-ADD ON

## 2014-05-19 LAB — CBG MONITORING, ED: GLUCOSE-CAPILLARY: 83 mg/dL (ref 70–99)

## 2014-05-19 MED ORDER — HYDROCHLOROTHIAZIDE 12.5 MG PO TABS: 12.5000 mg | ORAL_TABLET | Freq: Every day | ORAL | Status: DC

## 2014-05-19 MED ORDER — LISINOPRIL 10 MG PO TABS: 10.0000 mg | ORAL_TABLET | Freq: Every day | ORAL | Status: DC

## 2014-05-19 NOTE — Discharge Instructions (Signed)
Hypertension °Hypertension, commonly called high blood pressure, is when the force of blood pumping through your arteries is too strong. Your arteries are the blood vessels that carry blood from your heart throughout your body. A blood pressure reading consists of a higher number over a lower number, such as 110/72. The higher number (systolic) is the pressure inside your arteries when your heart pumps. The lower number (diastolic) is the pressure inside your arteries when your heart relaxes. Ideally you want your blood pressure below 120/80. °Hypertension forces your heart to work harder to pump blood. Your arteries may become narrow or stiff. Having hypertension puts you at risk for heart disease, stroke, and other problems.  °RISK FACTORS °Some risk factors for high blood pressure are controllable. Others are not.  °Risk factors you cannot control include:  °· Race. You may be at higher risk if you are African American. °· Age. Risk increases with age. °· Gender. Men are at higher risk than women before age 45 years. After age 65, women are at higher risk than men. °Risk factors you can control include: °· Not getting enough exercise or physical activity. °· Being overweight. °· Getting too much fat, sugar, calories, or salt in your diet. °· Drinking too much alcohol. °SIGNS AND SYMPTOMS °Hypertension does not usually cause signs or symptoms. Extremely high blood pressure (hypertensive crisis) may cause headache, anxiety, shortness of breath, and nosebleed. °DIAGNOSIS  °To check if you have hypertension, your health care provider will measure your blood pressure while you are seated, with your arm held at the level of your heart. It should be measured at least twice using the same arm. Certain conditions can cause a difference in blood pressure between your right and left arms. A blood pressure reading that is higher than normal on one occasion does not mean that you need treatment. If one blood pressure reading  is high, ask your health care provider about having it checked again. °TREATMENT  °Treating high blood pressure includes making lifestyle changes and possibly taking medicine. Living a healthy lifestyle can help lower high blood pressure. You may need to change some of your habits. °Lifestyle changes may include: °· Following the DASH diet. This diet is high in fruits, vegetables, and whole grains. It is low in salt, red meat, and added sugars. °· Getting at least 2½ hours of brisk physical activity every week. °· Losing weight if necessary. °· Not smoking. °· Limiting alcoholic beverages. °· Learning ways to reduce stress. ° If lifestyle changes are not enough to get your blood pressure under control, your health care provider may prescribe medicine. You may need to take more than one. Work closely with your health care provider to understand the risks and benefits. °HOME CARE INSTRUCTIONS °· Have your blood pressure rechecked as directed by your health care provider.   °· Take medicines only as directed by your health care provider. Follow the directions carefully. Blood pressure medicines must be taken as prescribed. The medicine does not work as well when you skip doses. Skipping doses also puts you at risk for problems.   °· Do not smoke.   °· Monitor your blood pressure at home as directed by your health care provider.  °SEEK MEDICAL CARE IF:  °· You think you are having a reaction to medicines taken. °· You have recurrent headaches or feel dizzy. °· You have swelling in your ankles. °· You have trouble with your vision. °SEEK IMMEDIATE MEDICAL CARE IF: °· You develop a severe headache or confusion. °·   You have unusual weakness, numbness, or feel faint.  You have severe chest or abdominal pain.  You vomit repeatedly.  You have trouble breathing. MAKE SURE YOU:   Understand these instructions.  Will watch your condition.  Will get help right away if you are not doing well or get worse. Document  Released: 06/13/2005 Document Revised: 10/28/2013 Document Reviewed: 04/05/2013 [facility] Patient Information 2015 [place], [place]. This information is not intended to replace advice given to you by your health care provider. Make sure you discuss any questions you have with your health care provider.  Stasis Dermatitis Stasis dermatitis occurs when veins lose the ability to pump blood back to the heart (poor venous circulation). It causes a reddish-purple to brownish scaly, itchy rash on the legs. The rash comes from pooling of blood (stasis). CAUSES  This occurs because the veins do not work very well anymore or because pressure may be increased in the veins due to other conditions. With blood pooling, the increased pressure in the tiny blood vessels (capillaries) causes fluid to leak out of the capillaries into the tissue. The extra fluid makes it harder for the blood to feed the cells and get rid of waste products. SYMPTOMS  Stasis dermatitis appears as red, scaly, itchy patches on the legs. A yellowish or light brown discoloration is also present. Due to scratching or other injury, these patches can become an ulcer. This ulcer may remain for [place] periods of time. The ulcer can also become infected. Swelling of the legs is often present with stasis dermatitis. If the leg is swollen, this increases the risk of infection and further damage to the skin. Sometimes, intense itching, tingling, and burning occurs before signs of stasis dermatitis appear. You may find yourself scratching the insides of your ankles or rubbing your ankles together before the rash appears. After healing, there are often brown spots on the affected skin. DIAGNOSIS  Your caregiver makes this diagnosis based on an exam. Other tests may be done to better understand the cause. TREATMENT  If underlying conditions are present, they must be treated. Some of these conditions are heart failure, thyroid problems, poor nutrition, and  varicose veins.  Cortisone creams and ointments applied to the skin (topically) may be needed, as well as medicine to reduce swelling in the legs (diuretics).  Compression stockings or an elastic wrap may also be needed to reduce swelling.  If there is an infection, antibiotic medicines may also be used. HOME CARE INSTRUCTIONS   Try to rest and raise (elevate) the affected leg above the level of the heart, if possible.  Burow's solution wet packs applied for 30 minutes, 3 times daily, will help the weepy rash. Stop using the packs before your skin gets too dry. You can also use a mixture of 3 parts white vinegar to 1 quart water.  Grease your legs daily with ointments, such as petroleum jelly, to fight dryness.  Avoid scratching or injuring the area. SEEK IMMEDIATE MEDICAL CARE IF:   Your rash gets worse.  An ulcer forms.  You have an oral temperature above 102 F (38.9 C), not controlled by medicine.  You have any other severe symptoms. Document Released: 09/22/2005 Document Revised: 09/05/2011 Document Reviewed: 11/02/2009 [facility] Patient Information 2015 [place], [place]. This information is not intended to replace advice given to you by your health care provider. Make sure you discuss any questions you have with your health care provider.

## 2014-05-19 NOTE — Progress Notes (Signed)
CSW received consult regarding patient current social condition. CSW spoke iwht MD regarding medicatons and dematitis. CSW consulted rn cm to assist with possible HH. CSW to assess to make APS report as there are concerns regarding patient ability to care for self.   Byrd HesselbachKristen Maryuri Warnke, LCSW [phone]  ED CSW 05/19/2014 11:56 AM

## 2014-05-19 NOTE — Progress Notes (Signed)
CSW met with pt at bedside to complete psychosocial assessment. Concerns from EMS and friend in neighborhood concerned about patient well being. Pt is legally blind and has not received any routine medical care.   Pt shared that he currently lives in an apartment with his friend Ms. Dot. Pt states Ms Dot helps patient by cooking, cleaning, doing laundry, and checking on patient. Patient states he likes to walk around his neighborhood. Pt is legally blind and has walking stick there in the room with him. Pt states he is able to do most of his care but thankful that he has someone to help with meals and cleaning. Pt states he has been out of medicaitons for a while, and hasn't seen his primary care doctor in a while "probably sometime last year." CSW asked patient why, and he siad, "just didn't want to."   Per discussion with rn cm, pt family states they have tried to get patient to the doctor appointments in [place] however he refuses.  Pt needing wound care with feet. There are concerns that due to patient limited sight, patient is not aware of the needs for caring for his feet.  Patient does have access to phone, transprotaiton, and has all necessary utilities in the home. Pt has access to adequate food.   Current concerns are related to self neglect of no routine follow up medical care, and no care for dermatitis. Patient to be set up with home health services include rn and sw.  Patient states there are no animals or safety concerns for DSS social workers identified in interview.   Please see demographics for family support.  CSW faxing to APS to make report.

## 2014-05-19 NOTE — Progress Notes (Signed)
05/19/14 1636 Returned call to Bird CityBridget after CM received a voice message from her.  Briget states she initiated the APS visit r/t concern for pt living environment.  Inquired how to f/u with APS & [facility] Provided with DSS-APS & [facility] contact number.  Voiced concern of pt wearing dirty clothes, not eating food offered by meals on wheels and food noted with bugs.  States seh was very active in pt care until encouraged by Nicole Cellaorothy to decrease care.  Bridget ventilated her feelings and support offered to her Commended her for her concern and care of pt

## 2014-05-19 NOTE — ED Provider Notes (Signed)
CSN: [id number]     Arrival date & time 05/19/14  1026 History   First MD Initiated Contact with Patient 05/19/14 1055     Chief Complaint  Patient presents with  . Cellulitis     (Consider location/radiation/quality/duration/timing/severity/associated sxs/prior Treatment) HPI   73 year old male presenting to the emergency room for evaluation of lower extremities.  His exam is consistent with stasis dermatitis. He denies any acute trauma to the area. No fevers or chills. Patient is legally blind so he cannot comment with regards to their appearance. In terms of his symptoms, he has no acute complaints. He reports that he has no routine medical care. Noted to be hypertensive. He has been told he has hypertension previously, but does not currently take any medications. Apparently a friend or perhaps a neighbor involved EMS and social work given their concern for the appearance of his feet/lower extremities.    Past Medical History  Diagnosis Date  . Renal insufficiency     EMS reports "per familiy member "kidney problem"  . Legally blind     per ems  . Chronic alcohol abuse   . H/O enucleation of left eyeball    History reviewed. No pertinent past surgical history. No family history on file. History  Substance Use Topics  . Smoking status: Current Every Day Smoker    Types: Cigarettes  . Smokeless tobacco: Not on file  . Alcohol Use: Yes    Review of Systems  All systems reviewed and negative, other than as noted in HPI.   Allergies  Review of patient's allergies indicates no known allergies.  Home Medications   Prior to Admission medications   Not on File   BP 192/95 mmHg  Pulse 64  Temp(Src) 98.3 F (36.8 C) (Oral)  Resp 18  SpO2 100% Physical Exam  Constitutional: He appears well-developed and well-nourished. No distress.  HENT:  Head: Normocephalic and atraumatic.  Eyes: Conjunctivae are normal. Right eye exhibits no discharge. Left eye exhibits no  discharge.  Neck: Neck supple.  Cardiovascular: Normal rate, regular rhythm and normal heart sounds.  Exam reveals no gallop and no friction rub.   No murmur heard. Pulmonary/Chest: Effort normal and breath sounds normal. No respiratory distress.  Abdominal: Soft. He exhibits no distension. There is no tenderness.  Musculoskeletal: He exhibits no edema or tenderness.  Neurological: He is alert.  Skin: Skin is warm and dry.  B/l lower leg skin changes consistent with stasis dermatitis. No tenderness, erythema, increased warmth.   Psychiatric: He has a normal mood and affect. His behavior is normal. Thought content normal.  Nursing note and vitals reviewed.   ED Course  Procedures (including critical care time) Labs Review Labs Reviewed  BASIC METABOLIC PANEL - Abnormal; Notable for the following:    BUN 27 (*)    Calcium 11.2 (*)    GFR calc non Af Amer 55 (*)    GFR calc Af Amer 64 (*)    All other components within normal limits  URINALYSIS, ROUTINE W REFLEX MICROSCOPIC - Abnormal; Notable for the following:    Leukocytes, UA SMALL (*)    All other components within normal limits  CBC WITH DIFFERENTIAL  URINE MICROSCOPIC-ADD ON  CBG MONITORING, ED    Imaging Review No results found.   EKG Interpretation None      MDM   Final diagnoses:  Venous stasis dermatitis of both lower extremities  Essential hypertension    73yM sent for evaluation of LE. Exam consistent  with stasis dermatitis. No evidence of cellulitis. Doubt DVT. HTN noted. Pt not currently on meds. No regular medical care. Social concerns. Social spoke with pt prior to DC.    Raeford RazorStephen Cheray Pardi, MD 05/28/14 1434

## 2014-05-19 NOTE — ED Notes (Signed)
Bed: WA02 Expected date:  Expected time:  Means of arrival:  Comments: Ems- elderly, HTN, no meds x1 year

## 2014-05-19 NOTE — Progress Notes (Signed)
CARE MANAGEMENT ED NOTE 05/19/2014  Patient:  [name],[name]   Account Number:  [id number]  Date Initiated:  05/19/2014  Documentation initiated by:  Edd ArbourGIBBS,Chelsie Burel  Subjective/Objective Assessment:   73 yr old [employer] [place] [place] pt from home, per ems social services involved. Pt co of bilateral leg cellulitis from knee to foot. Pt is hypertensive 210/102 no meds x1 year per RN note     Subjective/Objective Assessment Detail:   1 ED visit, no admissions in last 6 months [facility] for [facility] failure, uti and d/c home with self care, no home health services  [facility] Julie[facility] Manson[facility]izabeth [facility] Pt states this is incorrect -after speaking with niece, Yaakov GuthrieMarcel clarified pcp as [facility] Madilyn FiremanHayes in [place]  Labs look wnl except [facility] EDP, [place] aware  Sheldon SilvanDorothy Wilson cell [phone]; reports medical issues of her own states went to podiatrist withpt but can not recall dr name ; office off of [address]; [facility] but [facility]; [facility] grand [facility]  Nicole CellaDorothy does not drive  Pt lives with her: this is his girlfriend Pt reports no pain in legs reports he walks to store and in neighbor hood with his cane and no issues.  Pt unable to tell Cm why DSS SW visited him today States this is the first and only time he has seen the lady  Brother Lowanda FosterRobert Clark -unable to recall his number; Yaakov GuthrieMarcel called robert to ask about pcp name He is [facility] r/t Nicole CellaDorothy called him  Yaakov GuthrieMarcel- niece [phone] Pt and CM spoke with her at 1242 Pt recall her #, reports he previously lived with Yaakov GuthrieMarcel; Yaakov GuthrieMarcel reports pt has been taken to dr Madilyn FiremanHayes but he has been noncompliant. [place] in [place] but stops by to see pt and  offer care but refused at intervals reports pt with pmh DM 7 HTN but refused to take meds; [facility] but [facility] [place]  [place] from [place] "two [place] three years"  Briget [phone] no answer Pt 7 CM unable to leave message (mail box full)  Pt reports not taking meds but going to appointments only when made for him by his brother or nieces.  Agreed to go to dr if he had and appointment & to home health coming to see him in home     Action/Plan:   ED CM consulted by ED SW about SW consult received for pt. SW reviewed her assessment of pt. CM spoke with pt; called Nicole Cellaorothy; called Yaakov GuthrieMarcel, dialed Briget # Assisted pt to use urinal x 2 Updated ED RN & EDP   Action/Plan Detail:   [phone] [facility] transitional coordinator called to provide referral [facility]ed [facility], [facility] health & private duty Copies of all resources placed in belonging bag   Anticipated DC Date:  05/19/2014     Status Recommendation to Physician:   Result of Recommendation:    Other ED Services  Consult Working Plan    DC Planning Services  Other  Other  PCP issues   [facility]  HOME HEALTH   Choice offered to / List presented to:  C-1 Patient     HH arranged  HH-1 RN  HH-6 SOCIAL WORKER  HH agency  [facility]    Status of service:  Completed, signed off  ED Comments:  Reviewed resources with pt, Nicole CellaDorothy and Yaakov GuthrieMarcel. Yaakov GuthrieMarcel does not think pt would benefit from meals on wheels Reports noting food sitting out uneaten during visits  ED Comments Detail:  05/19/14 1317 [facility] from [facility]

## 2014-05-19 NOTE — ED Notes (Signed)
Pt from home, per ems social services involved. Pt co of bilateral leg cellulitis from knee to foot. Pt is hypertensive 210/102. Pt is alert and oriented.

## 2015-03-04 ENCOUNTER — Emergency Department (HOSPITAL_COMMUNITY)
Admission: EM | Admit: 2015-03-04 | Discharge: 2015-03-04 | Disposition: A | Discharge status: Home / Self Care | Payer: Medicare Other | Source: Home / Self Care | Attending: Emergency Medicine | Admitting: Emergency Medicine

## 2015-03-04 ENCOUNTER — Emergency Department (HOSPITAL_COMMUNITY): Payer: Medicare Other

## 2015-03-04 ENCOUNTER — Encounter (HOSPITAL_COMMUNITY): Payer: Self-pay | Admitting: *Deleted

## 2015-03-04 DIAGNOSIS — A419 Sepsis, unspecified organism: Secondary | ICD-10-CM | POA: Diagnosis not present

## 2015-03-04 DIAGNOSIS — Z87448 Personal history of other diseases of urinary system: Secondary | ICD-10-CM | POA: Insufficient documentation

## 2015-03-04 DIAGNOSIS — M79672 Pain in left foot: Secondary | ICD-10-CM | POA: Diagnosis not present

## 2015-03-04 DIAGNOSIS — H548 Legal blindness, as defined in USA: Secondary | ICD-10-CM | POA: Insufficient documentation

## 2015-03-04 DIAGNOSIS — Z720 Tobacco use: Secondary | ICD-10-CM | POA: Insufficient documentation

## 2015-03-04 DIAGNOSIS — L24A9 Irritant contact dermatitis due friction or contact with other specified body fluids: Secondary | ICD-10-CM

## 2015-03-04 DIAGNOSIS — T148XXA Other injury of unspecified body region, initial encounter: Secondary | ICD-10-CM

## 2015-03-04 DIAGNOSIS — L97521 Non-pressure chronic ulcer of other part of left foot limited to breakdown of skin: Secondary | ICD-10-CM

## 2015-03-04 DIAGNOSIS — Z79899 Other long term (current) drug therapy: Secondary | ICD-10-CM | POA: Insufficient documentation

## 2015-03-04 LAB — CBC WITH DIFFERENTIAL/PLATELET
BASOS ABS: 0 10*3/uL (ref 0.0–0.1)
Basophils Relative: 0 % (ref 0–1)
EOS ABS: 0.1 10*3/uL (ref 0.0–0.7)
EOS PCT: 1 % (ref 0–5)
HCT: 31 % — ABNORMAL LOW (ref 39.0–52.0)
Hemoglobin: 10.1 g/dL — ABNORMAL LOW (ref 13.0–17.0)
Lymphocytes Relative: 8 % — ABNORMAL LOW (ref 12–46)
Lymphs Abs: 1 10*3/uL (ref 0.7–4.0)
MCH: 29.2 pg (ref 26.0–34.0)
MCHC: 32.6 g/dL (ref 30.0–36.0)
MCV: 89.6 fL (ref 78.0–100.0)
Monocytes Absolute: 0.6 10*3/uL (ref 0.1–1.0)
Monocytes Relative: 4 % (ref 3–12)
Neutro Abs: 11.4 10*3/uL — ABNORMAL HIGH (ref 1.7–7.7)
Neutrophils Relative %: 87 % — ABNORMAL HIGH (ref 43–77)
PLATELETS: 495 10*3/uL — AB (ref 150–400)
RBC: 3.46 MIL/uL — AB (ref 4.22–5.81)
RDW: 13.5 % (ref 11.5–15.5)
WBC: 13.1 10*3/uL — AB (ref 4.0–10.5)

## 2015-03-04 LAB — BASIC METABOLIC PANEL
ANION GAP: 11 (ref 5–15)
BUN: 16 mg/dL (ref 6–20)
CO2: 26 mmol/L (ref 22–32)
Calcium: 10.2 mg/dL (ref 8.9–10.3)
Chloride: 97 mmol/L — ABNORMAL LOW (ref 101–111)
Creatinine, Ser: 1.16 mg/dL (ref 0.61–1.24)
Glucose, Bld: 132 mg/dL — ABNORMAL HIGH (ref 65–99)
POTASSIUM: 4 mmol/L (ref 3.5–5.1)
SODIUM: 134 mmol/L — AB (ref 135–145)

## 2015-03-04 LAB — I-STAT CG4 LACTIC ACID, ED: LACTIC ACID, VENOUS: 1.25 mmol/L (ref 0.5–2.0)

## 2015-03-04 MED ORDER — SULFAMETHOXAZOLE-TRIMETHOPRIM 800-160 MG PO TABS: 1.0000 | ORAL_TABLET | Freq: Two times a day (BID) | ORAL | Status: DC

## 2015-03-04 MED ORDER — HYDROCODONE-ACETAMINOPHEN 5-325 MG PO TABS
1.0000 | ORAL_TABLET | Freq: Four times a day (QID) | ORAL | Status: DC | PRN
Start: 2015-03-04 — End: 2015-03-06

## 2015-03-04 MED ORDER — OXYCODONE-ACETAMINOPHEN 5-325 MG PO TABS
1.0000 | ORAL_TABLET | Freq: Once | ORAL | Status: AC
  Administered 2015-03-04: 1 via ORAL
  Filled 2015-03-04: qty 1

## 2015-03-04 MED ORDER — CEPHALEXIN 500 MG PO CAPS: 500.0000 mg | ORAL_CAPSULE | Freq: Four times a day (QID) | ORAL | Status: DC

## 2015-03-04 NOTE — ED Notes (Signed)
Pt st's he has had a sore on left heal for some while.  Painful to ambulate

## 2015-03-04 NOTE — ED Provider Notes (Signed)
CSN: [id number]     Arrival date & time 03/04/15  1349 History   First MD Initiated Contact with Patient 03/04/15 1601     Chief Complaint  Patient presents with  . Foot Pain     (Consider location/radiation/quality/duration/timing/severity/associated sxs/prior Treatment) HPI Comments: Pt comes in with c/o infection to the wound of his left heal. He states that he is not sure when it started but it is causing pain. He denies being a diabetic. He states that he used to see outpt clinic but he hasn't seen them in a while. Denies fever. Unsure of how the wound occured  The history is provided by the patient. No language interpreter was used.    Past Medical History  Diagnosis Date  . Renal insufficiency     EMS reports "per familiy member "kidney problem"  . Legally blind     per ems  . Chronic alcohol abuse   . H/O enucleation of left eyeball    History reviewed. No pertinent past surgical history. No family history on file. Social History  Substance Use Topics  . Smoking status: Current Every Day Smoker    Types: Cigarettes  . Smokeless tobacco: None  . Alcohol Use: Yes    Review of Systems  All other systems reviewed and are negative.     Allergies  Review of patient's allergies indicates no known allergies.  Home Medications   Prior to Admission medications   Medication Sig Start Date End Date Taking? Authorizing Provider  hydrochlorothiazide (HYDRODIURIL) 12.5 MG tablet Take 1 tablet (12.5 mg total) by mouth daily. 05/19/14   Raeford Razor, MD  lisinopril (PRINIVIL,ZESTRIL) 10 MG tablet Take 1 tablet (10 mg total) by mouth daily. 05/19/14   Raeford Razor, MD   BP 121/93 mmHg  Pulse 115  Temp(Src) 97.9 F (36.6 C) (Oral)  Resp 18  SpO2 100% Physical Exam  Constitutional: He is oriented to person, place, and time. He appears well-developed and well-nourished.  Cardiovascular: Normal rate and regular rhythm.   Pulmonary/Chest: Effort normal and breath sounds  normal.  Musculoskeletal: Normal range of motion.  Neurological: He is alert and oriented to person, place, and time. Coordination normal.  Skin:  Wound noted to the heal of the left foot. Pulses intact. No redness noted. Dried blood noted to the area  Nursing note and vitals reviewed.   ED Course  Procedures (including critical care time) Labs Review Labs Reviewed  CBC WITH DIFFERENTIAL/PLATELET - Abnormal; Notable for the following:    WBC 13.1 (*)    RBC 3.46 (*)    Hemoglobin 10.1 (*)    HCT 31.0 (*)    Platelets 495 (*)    Neutrophils Relative % 87 (*)    Neutro Abs 11.4 (*)    Lymphocytes Relative 8 (*)    All other components within normal limits  BASIC METABOLIC PANEL - Abnormal; Notable for the following:    Sodium 134 (*)    Chloride 97 (*)    Glucose, Bld 132 (*)    All other components within normal limits  I-STAT CG4 LACTIC ACID, ED    Imaging Review Dg Foot Complete Left  03/04/2015   CLINICAL DATA:  Open wound heal  EXAM: LEFT FOOT - COMPLETE 3+ VIEW  COMPARISON:  None.  FINDINGS: Wound on the plantar surface of the heel. Negative for osteomyelitis. There is mild spurring of the heel calcaneus.  Mild degenerative change in the first MTP.  Negative for fracture.  IMPRESSION: Negative  for fracture or osteomyelitis.   Electronically Signed   By: Marlan Palau M.D.   On: 03/04/2015 14:56   I have personally reviewed and evaluated these images and lab results as part of my medical decision-making.   EKG Interpretation None      MDM   Final diagnoses:  Wound drainage    Nursing staff cleaned wound and tissue is well granulated. Doubt osteomylitis.discussed follow up with outpt clinic or wound care.pt and wife verbalized understanding    Teressa Lower, NP 03/04/15 1757  Marily Memos, MD 03/05/15 [phone]

## 2015-03-04 NOTE — ED Notes (Signed)
DSD applied to left foot 

## 2015-03-04 NOTE — Discharge Instructions (Signed)
You need to follow up with outpt clinic or wound care Skin Ulcer A skin ulcer is an open sore that can be shallow or deep. Skin ulcers sometimes become infected and are difficult to treat. It may be 1 month or longer before real healing progress is made. CAUSES   Injury.  Problems with the veins or arteries.  Diabetes.  Insect bites.  Bedsores.  Inflammatory conditions. SYMPTOMS   Pain, redness, swelling, and tenderness around the ulcer.  Fever.  Bleeding from the ulcer.  Yellow or clear fluid coming from the ulcer. DIAGNOSIS  There are many types of skin ulcers. Any open sores will be examined. Certain tests will be done to determine the kind of ulcer you have. The right treatment depends on the type of ulcer you have. TREATMENT  Treatment is a Squier-term challenge. It may include:  Wearing an elastic wrap, compression stockings, or gel cast over the ulcer area.  Taking antibiotic medicines or putting antibiotic creams on the affected area if there is an infection. HOME CARE INSTRUCTIONS  Put on your bandages (dressings), wraps, or casts over the ulcer as directed by your caregiver.  Change all dressings as directed by your caregiver.  Take all medicines as directed by your caregiver.  Keep the affected area clean and dry.  Avoid injuries to the affected area.  Eat a well-balanced, healthy diet that includes plenty of fruit and vegetables.  If you smoke, consider quitting or decreasing the amount of cigarettes you smoke.  Once the ulcer heals, get regular exercise as directed by your caregiver.  Work with your caregiver to make sure your blood pressure, cholesterol, and diabetes are well-controlled.  Keep your skin moisturized. Dry skin can crack and lead to skin ulcers. SEEK IMMEDIATE MEDICAL CARE IF:   Your pain gets worse.  You have swelling, redness, or fluids around the ulcer.  You have chills.  You have a fever. MAKE SURE YOU:   Understand these  instructions.  Will watch your condition.  Will get help right away if you are not doing well or get worse. Document Released: 07/21/2004 Document Revised: 09/05/2011 Document Reviewed: 01/28/2011 [facility] Patient Information 2015 [place], [place]. This information is not intended to replace advice given to you by your health care provider. Make sure you discuss any questions you have with your health care provider.

## 2015-03-04 NOTE — ED Notes (Signed)
Pt noted to have wound to left heel. Pt has drainage and odor. Pt reports pain that extends up leg as well.

## 2015-03-06 ENCOUNTER — Emergency Department (HOSPITAL_COMMUNITY): Payer: Medicare Other

## 2015-03-06 ENCOUNTER — Encounter (HOSPITAL_COMMUNITY): Payer: Self-pay | Admitting: Emergency Medicine

## 2015-03-06 ENCOUNTER — Inpatient Hospital Stay (HOSPITAL_COMMUNITY)
Admission: EM | Admit: 2015-03-06 | Discharge: 2015-03-18 | DRG: 853 | Disposition: A | Discharge status: Skilled Nursing Facility | Payer: Medicare Other | Attending: Family Medicine | Admitting: Family Medicine

## 2015-03-06 DIAGNOSIS — A419 Sepsis, unspecified organism: Principal | ICD-10-CM

## 2015-03-06 DIAGNOSIS — Z515 Encounter for palliative care: Secondary | ICD-10-CM | POA: Insufficient documentation

## 2015-03-06 DIAGNOSIS — M79672 Pain in left foot: Secondary | ICD-10-CM | POA: Diagnosis present

## 2015-03-06 DIAGNOSIS — L89629 Pressure ulcer of left heel, unspecified stage: Secondary | ICD-10-CM | POA: Diagnosis present

## 2015-03-06 DIAGNOSIS — Z23 Encounter for immunization: Secondary | ICD-10-CM | POA: Diagnosis not present

## 2015-03-06 DIAGNOSIS — Z66 Do not resuscitate: Secondary | ICD-10-CM | POA: Diagnosis not present

## 2015-03-06 DIAGNOSIS — R011 Cardiac murmur, unspecified: Secondary | ICD-10-CM | POA: Diagnosis present

## 2015-03-06 DIAGNOSIS — L03032 Cellulitis of left toe: Secondary | ICD-10-CM | POA: Diagnosis not present

## 2015-03-06 DIAGNOSIS — Z9114 Patient's other noncompliance with medication regimen: Secondary | ICD-10-CM | POA: Diagnosis present

## 2015-03-06 DIAGNOSIS — Z79899 Other long term (current) drug therapy: Secondary | ICD-10-CM

## 2015-03-06 DIAGNOSIS — I129 Hypertensive chronic kidney disease with stage 1 through stage 4 chronic kidney disease, or unspecified chronic kidney disease: Secondary | ICD-10-CM | POA: Diagnosis present

## 2015-03-06 DIAGNOSIS — L97909 Non-pressure chronic ulcer of unspecified part of unspecified lower leg with unspecified severity: Secondary | ICD-10-CM

## 2015-03-06 DIAGNOSIS — D72829 Elevated white blood cell count, unspecified: Secondary | ICD-10-CM | POA: Diagnosis present

## 2015-03-06 DIAGNOSIS — F4322 Adjustment disorder with anxiety: Secondary | ICD-10-CM | POA: Diagnosis not present

## 2015-03-06 DIAGNOSIS — I10 Essential (primary) hypertension: Secondary | ICD-10-CM

## 2015-03-06 DIAGNOSIS — I70299 Other atherosclerosis of native arteries of extremities, unspecified extremity: Secondary | ICD-10-CM

## 2015-03-06 DIAGNOSIS — H540 Blindness, both eyes: Secondary | ICD-10-CM | POA: Diagnosis not present

## 2015-03-06 DIAGNOSIS — L0390 Cellulitis, unspecified: Secondary | ICD-10-CM | POA: Diagnosis present

## 2015-03-06 DIAGNOSIS — I96 Gangrene, not elsewhere classified: Secondary | ICD-10-CM

## 2015-03-06 DIAGNOSIS — I739 Peripheral vascular disease, unspecified: Secondary | ICD-10-CM | POA: Diagnosis present

## 2015-03-06 DIAGNOSIS — N170 Acute kidney failure with tubular necrosis: Secondary | ICD-10-CM | POA: Diagnosis present

## 2015-03-06 DIAGNOSIS — R52 Pain, unspecified: Secondary | ICD-10-CM | POA: Diagnosis not present

## 2015-03-06 DIAGNOSIS — H548 Legal blindness, as defined in USA: Secondary | ICD-10-CM | POA: Diagnosis present

## 2015-03-06 DIAGNOSIS — N183 Chronic kidney disease, stage 3 (moderate): Secondary | ICD-10-CM | POA: Diagnosis present

## 2015-03-06 DIAGNOSIS — R0602 Shortness of breath: Secondary | ICD-10-CM

## 2015-03-06 DIAGNOSIS — R4182 Altered mental status, unspecified: Secondary | ICD-10-CM | POA: Diagnosis present

## 2015-03-06 DIAGNOSIS — F17210 Nicotine dependence, cigarettes, uncomplicated: Secondary | ICD-10-CM | POA: Diagnosis present

## 2015-03-06 DIAGNOSIS — D649 Anemia, unspecified: Secondary | ICD-10-CM | POA: Diagnosis present

## 2015-03-06 DIAGNOSIS — L8990 Pressure ulcer of unspecified site, unspecified stage: Secondary | ICD-10-CM

## 2015-03-06 DIAGNOSIS — D638 Anemia in other chronic diseases classified elsewhere: Secondary | ICD-10-CM | POA: Diagnosis present

## 2015-03-06 DIAGNOSIS — E1152 Type 2 diabetes mellitus with diabetic peripheral angiopathy with gangrene: Secondary | ICD-10-CM | POA: Diagnosis present

## 2015-03-06 DIAGNOSIS — N179 Acute kidney failure, unspecified: Secondary | ICD-10-CM | POA: Diagnosis present

## 2015-03-06 DIAGNOSIS — E1122 Type 2 diabetes mellitus with diabetic chronic kidney disease: Secondary | ICD-10-CM | POA: Diagnosis present

## 2015-03-06 DIAGNOSIS — E11621 Type 2 diabetes mellitus with foot ulcer: Secondary | ICD-10-CM | POA: Diagnosis present

## 2015-03-06 DIAGNOSIS — H543 Unqualified visual loss, both eyes: Secondary | ICD-10-CM

## 2015-03-06 HISTORY — DX: Type 2 diabetes mellitus without complications: E11.9

## 2015-03-06 LAB — CBC WITH DIFFERENTIAL/PLATELET
BASOS PCT: 0 % (ref 0–1)
Basophils Absolute: 0 10*3/uL (ref 0.0–0.1)
EOS ABS: 0 10*3/uL (ref 0.0–0.7)
Eosinophils Relative: 0 % (ref 0–5)
HEMATOCRIT: 28 % — AB (ref 39.0–52.0)
HEMOGLOBIN: 9.3 g/dL — AB (ref 13.0–17.0)
LYMPHS ABS: 0.7 10*3/uL (ref 0.7–4.0)
Lymphocytes Relative: 4 % — ABNORMAL LOW (ref 12–46)
MCH: 30.3 pg (ref 26.0–34.0)
MCHC: 33.2 g/dL (ref 30.0–36.0)
MCV: 91.2 fL (ref 78.0–100.0)
Monocytes Absolute: 0.8 10*3/uL (ref 0.1–1.0)
Monocytes Relative: 4 % (ref 3–12)
NEUTROS ABS: 18 10*3/uL — AB (ref 1.7–7.7)
NEUTROS PCT: 92 % — AB (ref 43–77)
Platelets: 440 10*3/uL — ABNORMAL HIGH (ref 150–400)
RBC: 3.07 MIL/uL — AB (ref 4.22–5.81)
RDW: 13.6 % (ref 11.5–15.5)
WBC: 19.7 10*3/uL — AB (ref 4.0–10.5)

## 2015-03-06 LAB — BASIC METABOLIC PANEL
Anion gap: 10 (ref 5–15)
BUN: 28 mg/dL — ABNORMAL HIGH (ref 6–20)
CHLORIDE: 99 mmol/L — AB (ref 101–111)
CO2: 26 mmol/L (ref 22–32)
Calcium: 10.1 mg/dL (ref 8.9–10.3)
Creatinine, Ser: 1.38 mg/dL — ABNORMAL HIGH (ref 0.61–1.24)
GFR calc non Af Amer: 50 mL/min — ABNORMAL LOW (ref >60)
GFR, EST AFRICAN AMERICAN: 57 mL/min — AB (ref >60)
Glucose, Bld: 133 mg/dL — ABNORMAL HIGH (ref 65–99)
POTASSIUM: 4.2 mmol/L (ref 3.5–5.1)
SODIUM: 135 mmol/L (ref 135–145)

## 2015-03-06 LAB — URINE MICROSCOPIC-ADD ON

## 2015-03-06 LAB — URINALYSIS, ROUTINE W REFLEX MICROSCOPIC
Bilirubin Urine: NEGATIVE
Glucose, UA: NEGATIVE mg/dL
Hgb urine dipstick: NEGATIVE
Ketones, ur: NEGATIVE mg/dL
Nitrite: NEGATIVE
PROTEIN: NEGATIVE mg/dL
Specific Gravity, Urine: 1.014 (ref 1.005–1.030)
UROBILINOGEN UA: 1 mg/dL (ref 0.0–1.0)
pH: 6 (ref 5.0–8.0)

## 2015-03-06 MED ORDER — MORPHINE SULFATE (PF) 4 MG/ML IV SOLN
4.0000 mg | Freq: Once | INTRAVENOUS | Status: AC
  Administered 2015-03-06: 4 mg via INTRAVENOUS
  Filled 2015-03-06: qty 1

## 2015-03-06 MED ORDER — AMLODIPINE BESYLATE 5 MG PO TABS
5.0000 mg | ORAL_TABLET | Freq: Every day | ORAL | Status: DC
  Administered 2015-03-06 – 2015-03-14 (×8): 5 mg via ORAL
  Filled 2015-03-06 (×8): qty 1

## 2015-03-06 MED ORDER — SODIUM CHLORIDE 0.9 % IV SOLN
Freq: Once | INTRAVENOUS | Status: AC
  Administered 2015-03-06: 17:00:00 via INTRAVENOUS

## 2015-03-06 MED ORDER — ONDANSETRON HCL 4 MG/2ML IJ SOLN: 4.0000 mg | Freq: Four times a day (QID) | INTRAMUSCULAR | Status: DC | PRN

## 2015-03-06 MED ORDER — VANCOMYCIN HCL IN DEXTROSE 750-5 MG/150ML-% IV SOLN
750.0000 mg | INTRAVENOUS | Status: DC
  Administered 2015-03-07 – 2015-03-09 (×3): 750 mg via INTRAVENOUS
  Filled 2015-03-06 (×3): qty 150

## 2015-03-06 MED ORDER — MORPHINE SULFATE (PF) 2 MG/ML IV SOLN
2.0000 mg | INTRAVENOUS | Status: DC | PRN
  Administered 2015-03-07 – 2015-03-09 (×3): 2 mg via INTRAVENOUS
  Filled 2015-03-06 (×3): qty 1

## 2015-03-06 MED ORDER — ACETAMINOPHEN 650 MG RE SUPP: 650.0000 mg | Freq: Four times a day (QID) | RECTAL | Status: DC | PRN

## 2015-03-06 MED ORDER — VANCOMYCIN HCL IN DEXTROSE 1-5 GM/200ML-% IV SOLN
1000.0000 mg | Freq: Once | INTRAVENOUS | Status: AC
  Administered 2015-03-06: 1000 mg via INTRAVENOUS
  Filled 2015-03-06: qty 200

## 2015-03-06 MED ORDER — PIPERACILLIN-TAZOBACTAM 3.375 G IVPB
3.3750 g | Freq: Three times a day (TID) | INTRAVENOUS | Status: DC
Start: 2015-03-07 — End: 2015-03-11
  Administered 2015-03-07 – 2015-03-11 (×14): 3.375 g via INTRAVENOUS
  Filled 2015-03-06 (×15): qty 50

## 2015-03-06 MED ORDER — SODIUM CHLORIDE 0.45 % IV SOLN
INTRAVENOUS | Status: DC
  Administered 2015-03-06 – 2015-03-07 (×2): via INTRAVENOUS

## 2015-03-06 MED ORDER — ALUM & MAG HYDROXIDE-SIMETH 200-200-20 MG/5ML PO SUSP: 30.0000 mL | Freq: Four times a day (QID) | ORAL | Status: DC | PRN

## 2015-03-06 MED ORDER — CLINDAMYCIN PHOSPHATE 600 MG/50ML IV SOLN
600.0000 mg | Freq: Three times a day (TID) | INTRAVENOUS | Status: DC
  Administered 2015-03-06: 600 mg via INTRAVENOUS
  Filled 2015-03-06: qty 50

## 2015-03-06 MED ORDER — SODIUM CHLORIDE 0.9 % IV SOLN: Freq: Once | INTRAVENOUS | Status: DC

## 2015-03-06 MED ORDER — ENOXAPARIN SODIUM 40 MG/0.4ML ~~LOC~~ SOLN
40.0000 mg | SUBCUTANEOUS | Status: DC
  Administered 2015-03-06 – 2015-03-17 (×12): 40 mg via SUBCUTANEOUS
  Filled 2015-03-06 (×12): qty 0.4

## 2015-03-06 MED ORDER — DOCUSATE SODIUM 100 MG PO CAPS
100.0000 mg | ORAL_CAPSULE | Freq: Two times a day (BID) | ORAL | Status: DC
  Administered 2015-03-06 – 2015-03-18 (×18): 100 mg via ORAL
  Filled 2015-03-06 (×18): qty 1

## 2015-03-06 MED ORDER — ONDANSETRON HCL 4 MG PO TABS: 4.0000 mg | ORAL_TABLET | Freq: Four times a day (QID) | ORAL | Status: DC | PRN

## 2015-03-06 MED ORDER — OXYCODONE HCL 5 MG PO TABS
5.0000 mg | ORAL_TABLET | ORAL | Status: DC | PRN
  Administered 2015-03-06 – 2015-03-14 (×16): 5 mg via ORAL
  Filled 2015-03-06 (×17): qty 1

## 2015-03-06 MED ORDER — SODIUM CHLORIDE 0.9 % IV BOLUS (SEPSIS)
500.0000 mL | Freq: Once | INTRAVENOUS | Status: AC
  Administered 2015-03-06: 500 mL via INTRAVENOUS

## 2015-03-06 MED ORDER — PIPERACILLIN-TAZOBACTAM 3.375 G IVPB 30 MIN
3.3750 g | Freq: Once | INTRAVENOUS | Status: AC
  Administered 2015-03-06: 3.375 g via INTRAVENOUS
  Filled 2015-03-06: qty 50

## 2015-03-06 MED ORDER — ACETAMINOPHEN 325 MG PO TABS
650.0000 mg | ORAL_TABLET | Freq: Four times a day (QID) | ORAL | Status: DC | PRN
  Administered 2015-03-09: 650 mg via ORAL
  Filled 2015-03-06: qty 2

## 2015-03-06 NOTE — ED Notes (Signed)
Bed: ZO10 Expected date:  Expected time:  Means of arrival:  Comments: Margo Aye c

## 2015-03-06 NOTE — H&P (Addendum)
[facility] History and Physical  [name]:[id number] DOB: 1941/05/23 DOA: 03/06/2015   PCP: Candi Leash, MD    Chief Complaint: Pain in left foot  HPI: [name] is a 74 y.o. male is legally blind presents to the hospital for left foot pain. He was here couple of days ago and was given prescriptions for Keflex and Bactrim but did not have transportation to the pharmacy to get them filled. He returns due to increasing pain in his foot. He has no complaints of fevers chills, shortness or breath, cough, sinus drainage, vomiting or diarrhea.   He appears to have a infected wound on his heel with leukocytosis and has acute renal failure for which he is going to be admitted. States the wound has been present for over a month and recently has been more painful. Due to blindness, he is unable to tell me if the wound has changed recently or has had any discharge.   General: The patient denies anorexia, fever, weight loss Cardiac: Denies chest pain, syncope, palpitations, pedal edema  Respiratory: Denies cough, shortness of breath, wheezing GI: Denies severe indigestion/heartburn, abdominal pain, nausea, vomiting, diarrhea and constipation GU: Denies hematuria, incontinence, dysuria + hesitancy Musculoskeletal: Denies arthritis  Skin: Denies suspicious skin lesions Neurologic: Denies focal weakness or numbness, change in vision Psychiatry: Denies depression or anxiety. Hematologic: no easy bruising or bleeding  All other systems reviewed and found to be negative.  Past Medical History  Diagnosis Date  . Legally blind     per ems  . H/O enucleation of left eyeball     History reviewed. No pertinent past surgical history.  Social History: 1 pack of cigarettes lasts about 4-5 day; does not drink any alcohol; no drug use Lives with a friend- walks independantly    No Known Allergies  Family history:   Family History  Problem Relation Age of Onset  . Family history  unknown: Yes      Prior to Admission medications   Not on File     Physical Exam: Filed Vitals:   03/06/15 1250 03/06/15 1500 03/06/15 1505 03/06/15 1652  BP: 186/68 152/81 152/81 161/74  Pulse: 114 91 93 86  Temp: 98.4 F (36.9 C)  97.9 F (36.6 C) 98.1 F (36.7 C)  TempSrc:   Oral Oral  Resp: SpO2: 98% 98% 99% 98%     General: Frail appearing male sitting up in bed in no acute distress HEENT: Normocephalic and Atraumatic, Mucous membranes pink                 left enucleated-right eye opaque: No scleral icterus,                 Nares: Patent, Oropharynx: Clear, poor Dentition                 Neck: FROM, no cervical lymphadenopathy, thyromegaly, carotid bruit or JVD;  Breasts: deferred CHEST WALL: No tenderness  CHEST: Normal respiration, clear to auscultation bilaterally  HEART: Regular rate and rhythm; no murmurs rubs or gallops  BACK: No kyphosis or scoliosis; no CVA tenderness  GI: Positive Bowel Sounds, soft, non-tender; no masses, no organomegaly Rectal Exam: deferred MSK: No cyanosis, clubbing, or edema- necrotic ulcer on left heel plantar aspect-  no discharge, +surrounding tenderness and erythema with foul smell Genitalia: not examined  SKIN:  no rash or ulceration  CNS: Alert and Oriented x 4, Nonfocal exam, CN 2-12 intact  Labs  on Admission:  Basic Metabolic Panel:  Recent Labs Lab 03/04/15 1453 03/06/15 1352  NA 134* 135  K 4.0 4.2  CL 97* 99*  CO2 26 26  GLUCOSE 132* 133*  BUN 16 28*  CREATININE 1.16 1.38*  CALCIUM 10.2 10.1   Liver Function Tests: No results for input(s): AST, ALT, ALKPHOS, BILITOT, PROT, ALBUMIN in the last 168 hours. No results for input(s): LIPASE, AMYLASE in the last 168 hours. No results for input(s): AMMONIA in the last 168 hours. CBC:  Recent Labs Lab 03/04/15 1453 03/06/15 1352  WBC 13.1* 19.7*  NEUTROABS 11.4* 18.0*  HGB 10.1* 9.3*  HCT 31.0* 28.0*  MCV 89.6 91.2  PLT 495* 440*   Cardiac  Enzymes: No results for input(s): CKTOTAL, CKMB, CKMBINDEX, TROPONINI in the last 168 hours.  BNP (last 3 results) No results for input(s): BNP in the last 8760 hours.  ProBNP (last 3 results) No results for input(s): PROBNP in the last 8760 hours.  CBG: No results for input(s): GLUCAP in the last 168 hours.  Radiological Exams on Admission: Dg Foot Complete Left  03/06/2015   CLINICAL DATA:  Soft tissue wound to the left foot, foot pain  EXAM: LEFT FOOT - COMPLETE 3+ VIEW  COMPARISON:  03/04/2015 similar exam performed at [facility]  FINDINGS: Bones are subjectively osteopenic. No fracture or dislocation. No radiopaque foreign body. Plantar calcaneal spurring is noted. Vascular calcifications are partly visualized.  IMPRESSION: Subjective osteopenia without acute osseous abnormality.   Electronically Signed   By: Christiana Pellant M.D.   On: 03/06/2015 15:54    EKG: Independently reviewed. Sinus tachycardia at 116 bpm  Assessment/Plan Principal Problem:   Sepsis-tachycardia, leukocytosis - Necrotic ulcer on left foot is likely the source of infection. No other source found at this time -X-ray of the foot is negative for osteomyelitis - Start vancomycin and Zosyn -Obtain blood cultures prior starting above antibiotics - may be related to poor blood flow- (see below) consult vasc surgery for possible need for debridement  Active Problems: Poor dorsalis pedis pulse in left foot - Check ABIs    ARF (acute renal failure) - Prerenal? Given 500 cc bolus in ER -Hydrate slowly and follow  Hypertension -BP elevated here and home med list mentions HCTZ and lisinopril-patient states that he does not take any medication and does not have PCP -We'll start patient on Norvasc and follow BP  Anemia - check anemia panel  Legally blind    Consulted: Vascular Surgery- left message with OR nurse for Dr Imogene Burn- he will see patient in AM  Code Status: full code  Family  Communication: Friend whom he lives with-dorothy wilson [phone] DVT Prophylaxis: Lovenox  Time spent: 50 minutes  Asley Baskerville, MD [facility]  If 7PM-7AM, please contact night-coverage [email or web address] 03/06/2015, 4:57 PM

## 2015-03-06 NOTE — ED Notes (Addendum)
74 yo with left heel ulcer x2 months. Recently dc from [place] with uti. Pt none compliant with meds and has follow up but not completed. Hx OF dm. Pt is Blind in both eyes.   170/100 BP 116 hr 139 CBG   Addendum: Pt not dx with urinary tract infection.

## 2015-03-06 NOTE — ED Notes (Signed)
MD at bedside. 

## 2015-03-06 NOTE — Progress Notes (Signed)
Patient with an unstageable wound to left heel due to eschar. Wound has minimal foul smelling serosanguinous drainage and periwound is purple/black and boggy.Prevalon boots placed but patient c/o pain to left heel and wanted boot removed. Left heel was floated off a pillow. Patient has a butterfly shaped wound to buttocks/sacral  area. Right buttocks has several denuded areas and left buttocks has a 1.7 x2.5 cm partial thickness wound. Will continue to monitor wounds for changes.

## 2015-03-06 NOTE — Progress Notes (Addendum)
ANTIBIOTIC CONSULT NOTE - INITIAL  Pharmacy Consult for Vancomycin, Zosyn Indication: rule out sepsis, cellulitis  No Known Allergies  Patient Measurements: Height:  (162.6 cm) Weight: 122 lb 4.8 oz (55.475 kg) IBW/kg (Calculated) : 59.2  Vital Signs: Temp: 98.1 F (36.7 C) (09/09 1716) Temp Source: Oral (09/09 1716) BP: 151/88 mmHg (09/09 1716) Pulse Rate: 98 (09/09 1716) Intake/Output from previous day:   Intake/Output from this shift: Total I/O In: 500 [IV Piggyback:500] Out: -   Labs:  Recent Labs  03/04/15 1453 03/06/15 1352  WBC 13.1* 19.7*  HGB 10.1* 9.3*  PLT 495* 440*  CREATININE 1.16 1.38*   Estimated Creatinine Clearance: 38 mL/min (by C-G formula based on Cr of 1.38). No results for input(s): VANCOTROUGH, VANCOPEAK, VANCORANDOM, GENTTROUGH, GENTPEAK, GENTRANDOM, TOBRATROUGH, TOBRAPEAK, TOBRARND, AMIKACINPEAK, AMIKACINTROU, AMIKACIN in the last 72 hours.   Microbiology: No results found for this or any previous visit (from the past 720 hour(s)).  Medical History: Past Medical History  Diagnosis Date  . Renal insufficiency     EMS reports "per familiy member "kidney problem"  . Legally blind     per ems  . Chronic alcohol abuse   . H/O enucleation of left eyeball   . Diabetes mellitus without complication     Medications:  Anti-infectives    Start     Dose/Rate Route Frequency Ordered Stop   03/07/15 0000  piperacillin-tazobactam (ZOSYN) IVPB 3.375 g     3.375 g 12.5 mL/hr over 240 Minutes Intravenous Every 8 hours 03/06/15 1729     03/06/15 1730  vancomycin (VANCOCIN) IVPB 1000 mg/200 mL premix     1,000 mg 200 mL/hr over 60 Minutes Intravenous  Once 03/06/15 1703     03/06/15 1730  piperacillin-tazobactam (ZOSYN) IVPB 3.375 g     3.375 g 100 mL/hr over 30 Minutes Intravenous  Once 03/06/15 1729     03/06/15 1630  clindamycin (CLEOCIN) IVPB 600 mg  Status:  Discontinued     600 mg 100 mL/hr over 30 Minutes Intravenous 3 times per day  03/06/15 1620 03/06/15 1635     Assessment: 74 y.o. 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  Given prescriptions for Keflex and Bactrim several days ago but was unable to get prescriptions filled.  Meets sepsis criteria with tachycardia and leukocytosis with necrotic foot ulcer.  Foot X-ray negative for osteo.  Pharmacy to dose vancomycin/Zosyn for sepsis.  9/9 Clindamycin x 1 9/9 >> Zosyn >> 9/9 >> Vancomycin >>    Temp: afebrile WBC: 19.7 Renal: SCr sl elevated at baseline; CrCl 38 CG  9/9 blood: IP 9/9 urine: IP  Dose changes/levels:   Goal of Therapy:  Vancomycin trough level 15-20 mcg/ml  Eradication of infection Appropriate antibiotic dosing for indication and renal function  Plan:  Day 1 antibiotics Vancomycin 1000 mg IV now, then 750 mg IV q24 hr Measure vancomycin trough levels at steady state as indicated.  Can target lower troughs once sepsis resolved. Zosyn 3.375 g IV given once over 30 minutes, then every 8 hrs by 4-hr infusion  Follow clinical course, renal function, culture results as available  Follow for de-escalation of antibiotics and LOT   Bernadene Person, PharmD, BCPS Pager: [phone] 03/06/2015, 6:10 PM

## 2015-03-06 NOTE — ED Provider Notes (Signed)
CSN: [id number]     Arrival date & time 03/06/15  1236 History   First MD Initiated Contact with Patient 03/06/15 1244     Chief Complaint  Patient presents with  . Skin Ulcer    left heel     (Consider location/radiation/quality/duration/timing/severity/associated sxs/prior Treatment) HPI Comments: 74 y.o. Male with history of peripheral vascular disease, blindness presents for pain and left heel wound.  The patient reports that the wound started about 1 month ago and has been progressively worsening.  He was seen two days ago and prescribed Bactrim, keflex, and norco which he could not fill because he did not have a ride.  The patient denies fever, chills, nausea, vomiting, urinary symptoms, diarrhea.  He denies known injury to the foot.  He said the pain was so severe he had to call EMS to bring him back.   Past Medical History  Diagnosis Date  . Renal insufficiency     EMS reports "per familiy member "kidney problem"  . Legally blind     per ems  . Chronic alcohol abuse   . H/O enucleation of left eyeball    History reviewed. No pertinent past surgical history. Family History  Problem Relation Age of Onset  . Family history unknown: Yes   Social History  Substance Use Topics  . Smoking status: Current Every Day Smoker    Types: Cigarettes  . Smokeless tobacco: None  . Alcohol Use: Yes    Review of Systems  Constitutional: Negative for fever, chills and fatigue.  HENT: Negative for congestion and postnasal drip.   Respiratory: Negative for cough, chest tightness and shortness of breath.   Cardiovascular: Negative for chest pain and palpitations.  Gastrointestinal: Negative for nausea, vomiting, abdominal pain and diarrhea.  Genitourinary: Negative for dysuria and hematuria.  Musculoskeletal: Negative for myalgias and back pain.  Skin: Positive for wound (left heel).  Neurological: Negative for dizziness, weakness and numbness.  Hematological: Does not bruise/bleed  easily.      Allergies  Review of patient's allergies indicates no known allergies.  Home Medications   Prior to Admission medications   Medication Sig Start Date End Date Taking? Authorizing Provider  cephALEXin (KEFLEX) 500 MG capsule Take 1 capsule (500 mg total) by mouth 4 (four) times daily. Patient not taking: Reported on 03/06/2015 03/04/15   Teressa Lower, NP  hydrochlorothiazide (HYDRODIURIL) 12.5 MG tablet Take 1 tablet (12.5 mg total) by mouth daily. Patient not taking: Reported on 03/06/2015 05/19/14   Raeford Razor, MD  HYDROcodone-acetaminophen (NORCO/VICODIN) 5-325 MG per tablet Take 1-2 tablets by mouth every 6 (six) hours as needed. Patient not taking: Reported on 03/06/2015 03/04/15   Teressa Lower, NP  lisinopril (PRINIVIL,ZESTRIL) 10 MG tablet Take 1 tablet (10 mg total) by mouth daily. Patient not taking: Reported on 03/06/2015 05/19/14   Raeford Razor, MD  sulfamethoxazole-trimethoprim (BACTRIM DS,SEPTRA DS) 800-160 MG per tablet Take 1 tablet by mouth 2 (two) times daily. Patient not taking: Reported on 03/06/2015 03/04/15   Teressa Lower, NP   BP 152/81 mmHg  Pulse 93  Temp(Src) 97.9 F (36.6 C) (Oral)  Resp 17  SpO2 99% Physical Exam  Constitutional: He is oriented to person, place, and time. No distress.  HENT:  Head: Normocephalic and atraumatic.  Eyes:  Enucleated left eye, cloudy right cornea  Neck: Normal range of motion. Neck supple.  Cardiovascular: Regular rhythm.  Tachycardia present.  Exam reveals decreased pulses.   Pulses:      Dorsalis pedis pulses  are 1+ on the right side, and 1+ on the left side.  Pulmonary/Chest: Effort normal. No respiratory distress.  Abdominal: Soft. He exhibits no distension. There is no tenderness.  Neurological: He is alert and oriented to person, place, and time.  Skin: He is not diaphoretic.  Black, necrotic wound over the left heel with erythema and warmth of the skin in the area of the foot.  Vitals  reviewed.   ED Course  Procedures (including critical care time) Labs Review Labs Reviewed  CBC WITH DIFFERENTIAL/PLATELET - Abnormal; Notable for the following:    WBC 19.7 (*)    RBC 3.07 (*)    Hemoglobin 9.3 (*)    HCT 28.0 (*)    Platelets 440 (*)    Neutrophils Relative % 92 (*)    Neutro Abs 18.0 (*)    Lymphocytes Relative 4 (*)    All other components within normal limits  BASIC METABOLIC PANEL - Abnormal; Notable for the following:    Chloride 99 (*)    Glucose, Bld 133 (*)    BUN 28 (*)    Creatinine, Ser 1.38 (*)    GFR calc non Af Amer 50 (*)    GFR calc Af Amer 57 (*)    All other components within normal limits  URINALYSIS, ROUTINE W REFLEX MICROSCOPIC (NOT AT [facility]) - Abnormal; Notable for the following:    APPearance CLOUDY (*)    Leukocytes, UA TRACE (*)    All other components within normal limits  URINE MICROSCOPIC-ADD ON - Abnormal; Notable for the following:    Bacteria, UA MANY (*)    All other components within normal limits    Imaging Review Dg Foot Complete Left  03/06/2015   CLINICAL DATA:  Soft tissue wound to the left foot, foot pain  EXAM: LEFT FOOT - COMPLETE 3+ VIEW  COMPARISON:  03/04/2015 similar exam performed at [facility]  FINDINGS: Bones are subjectively osteopenic. No fracture or dislocation. No radiopaque foreign body. Plantar calcaneal spurring is noted. Vascular calcifications are partly visualized.  IMPRESSION: Subjective osteopenia without acute osseous abnormality.   Electronically Signed   By: Christiana Pellant M.D.   On: 03/06/2015 15:54   I have personally reviewed and evaluated these images and lab results as part of my medical decision-making.   EKG Interpretation None      MDM  Patient was seen and evaluated at bedside.  Tachycardic on presentation which responded to fluids.  Elevated WBC, elevated Cr.  Patient started on Vancomycin for wound and cellulitis.  IV hydration continued.  Discussed with dr.  Butler Denmark who agreed with admission and patient was admitted under her care.  Patient up dated on results and plan of care. Final diagnoses:  None    1. Infected left foot wound  2. Cellulitis  3. Leukocytosis  4. Acute kidney injury    Leta Baptist, MD 03/06/15 1757

## 2015-03-06 NOTE — ED Notes (Signed)
Nurse drawing labs. 

## 2015-03-07 ENCOUNTER — Inpatient Hospital Stay (HOSPITAL_COMMUNITY): Payer: Medicare Other

## 2015-03-07 DIAGNOSIS — R52 Pain, unspecified: Secondary | ICD-10-CM

## 2015-03-07 DIAGNOSIS — I96 Gangrene, not elsewhere classified: Secondary | ICD-10-CM

## 2015-03-07 DIAGNOSIS — H543 Unqualified visual loss, both eyes: Secondary | ICD-10-CM

## 2015-03-07 DIAGNOSIS — I10 Essential (primary) hypertension: Secondary | ICD-10-CM

## 2015-03-07 LAB — IRON AND TIBC
IRON: 18 ug/dL — AB (ref 45–182)
Saturation Ratios: 7 % — ABNORMAL LOW (ref 17.9–39.5)
TIBC: 262 ug/dL (ref 250–450)
UIBC: 244 ug/dL

## 2015-03-07 LAB — COMPREHENSIVE METABOLIC PANEL
ALBUMIN: 2.9 g/dL — AB (ref 3.5–5.0)
ALT: 26 U/L (ref 17–63)
ANION GAP: 10 (ref 5–15)
AST: 29 U/L (ref 15–41)
Alkaline Phosphatase: 71 U/L (ref 38–126)
BUN: 24 mg/dL — ABNORMAL HIGH (ref 6–20)
CHLORIDE: 98 mmol/L — AB (ref 101–111)
CO2: 23 mmol/L (ref 22–32)
Calcium: 9.8 mg/dL (ref 8.9–10.3)
Creatinine, Ser: 1.37 mg/dL — ABNORMAL HIGH (ref 0.61–1.24)
GFR calc Af Amer: 58 mL/min — ABNORMAL LOW (ref >60)
GFR calc non Af Amer: 50 mL/min — ABNORMAL LOW (ref >60)
GLUCOSE: 95 mg/dL (ref 65–99)
POTASSIUM: 4 mmol/L (ref 3.5–5.1)
SODIUM: 131 mmol/L — AB (ref 135–145)
TOTAL PROTEIN: 6.7 g/dL (ref 6.5–8.1)
Total Bilirubin: 0.8 mg/dL (ref 0.3–1.2)

## 2015-03-07 LAB — CBC
HCT: 26.7 % — ABNORMAL LOW (ref 39.0–52.0)
Hemoglobin: 8.9 g/dL — ABNORMAL LOW (ref 13.0–17.0)
MCH: 30.4 pg (ref 26.0–34.0)
MCHC: 33.3 g/dL (ref 30.0–36.0)
MCV: 91.1 fL (ref 78.0–100.0)
PLATELETS: 466 10*3/uL — AB (ref 150–400)
RBC: 2.93 MIL/uL — AB (ref 4.22–5.81)
RDW: 13.7 % (ref 11.5–15.5)
WBC: 14.7 10*3/uL — AB (ref 4.0–10.5)

## 2015-03-07 LAB — FERRITIN: Ferritin: 325 ng/mL (ref 24–336)

## 2015-03-07 LAB — RETICULOCYTES
RBC.: 3.19 MIL/uL — ABNORMAL LOW (ref 4.22–5.81)
RETIC CT PCT: 1 % (ref 0.4–3.1)
Retic Count, Absolute: 31.9 10*3/uL (ref 19.0–186.0)

## 2015-03-07 LAB — FOLATE: Folate: 17 ng/mL (ref >5.9)

## 2015-03-07 LAB — VITAMIN B12: VITAMIN B 12: 284 pg/mL (ref 180–914)

## 2015-03-07 MED ORDER — SODIUM CHLORIDE 0.9 % IV SOLN
INTRAVENOUS | Status: DC
  Administered 2015-03-07 (×2): via INTRAVENOUS

## 2015-03-07 MED ORDER — ENSURE ENLIVE PO LIQD
237.0000 mL | Freq: Three times a day (TID) | ORAL | Status: DC
  Administered 2015-03-07 – 2015-03-16 (×13): 237 mL via ORAL

## 2015-03-07 NOTE — Progress Notes (Addendum)
VASCULAR LAB PRELIMINARY  ARTERIAL  ABI completed:    RIGHT    LEFT    PRESSURE WAVEFORM  PRESSURE WAVEFORM  BRACHIAL 168 Triphasic BRACHIAL 130 Triphasic  DP 27 Severely dampened monophasic DP 57 Tardus parvus  AT   AT    PT 41 Tardus parvus PT  Unable to insonate  PER   PER    GREAT TOE Unable to insonate NA GREAT TOE Unable to insonate NA    RIGHT LEFT  ABI 0.24 0.34    The right ABI is suggestive of critical arterial insufficiency, the left ABI is suggestive of severe, borderline critical arterial insufficiency. Unable to obtain a discernable PPG waveform of bilateral great toes.  There is a difference between brachial pressures, suggestive of a possible proximal obstruction.  03/07/2015 11:22 AM Gertie Fey, RVT, RDCS, RDMS

## 2015-03-07 NOTE — Progress Notes (Addendum)
[facility] Progress Note   TREMEL SETTERS  ZOX:[id number]  DOB: [birth year]  DOA: 03/06/2015 PCP: Candi Leash, MD  Brief narrative: [name] is a 74 y.o. male is legally blind presents to the hospital for left foot pain. He was here couple of days ago and was given prescriptions for Keflex and Bactrim but did not have transportation to the pharmacy to get them filled. He returns due to increasing pain in his foot. He has no complaints of fevers chills, shortness or breath, cough, sinus drainage, vomiting or diarrhea.  Found to have infected wound on his left heel with leukocytosis and has acute renal failure   Subjective: Having pain in his foot- no nausea, vomiting, diarrhea or dyspnea.   Assessment/Plan: Principal Problem:  Sepsis-tachycardia, leukocytosis- gangrene - Necrotic ulcer on left foot is likely the source of infection. No other source found at this time -X-ray of the foot is negative for osteomyelitis - Started vancomycin and Zosyn -Obtained blood cultures prior starting above antibiotics  - vascular surgery feels he will need either an AKA or BKA  Active Problems: Poor dorsalis pedis pulse in left foot - Check ABIs   ARF (acute renal failure) - Cr 1.16 on 03/04/15 - Prerenal vs ATN - Given 500 cc bolus in ER -Hydrate slowly - Cr only slightly improved- urine output poor- follow I and O  Hypertension -BP elevated- home med list mentions HCTZ and lisinopril-patient states that he does not take any medication and does not have PCP -We'll start patient on Norvasc and follow BP  Murmur - left and right sternal border- not radiating to carotids - check ECHO  Anemia - check anemia panel  Legally blind  No PCP- have asked for case management assistance to help find PCP   Code Status:     Code Status Orders        Start     Ordered   03/06/15 1706  Full code   Continuous     03/06/15 1707     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: states to contact Friend  whom he lives with-dorothy wilson [phone] Disposition Plan: to be determined DVT prophylaxis: lovenox Consultants:vasc sx Procedures: Appt with PCP: no PCP yet Antibiotics: Anti-infectives    Start     Dose/Rate Route Frequency Ordered Stop   03/07/15 1800  vancomycin (VANCOCIN) IVPB 750 mg/150 ml premix     750 mg 150 mL/hr over 60 Minutes Intravenous Every 24 hours 03/06/15 1821     03/07/15 0000  piperacillin-tazobactam (ZOSYN) IVPB 3.375 g     3.375 g 12.5 mL/hr over 240 Minutes Intravenous Every 8 hours 03/06/15 1729     03/06/15 1730  vancomycin (VANCOCIN) IVPB 1000 mg/200 mL premix     1,000 mg 200 mL/hr over 60 Minutes Intravenous  Once 03/06/15 1703 03/06/15 1929   03/06/15 1730  piperacillin-tazobactam (ZOSYN) IVPB 3.375 g     3.375 g 100 mL/hr over 30 Minutes Intravenous  Once 03/06/15 1729 03/06/15 1858   03/06/15 1630  clindamycin (CLEOCIN) IVPB 600 mg  Status:  Discontinued     600 mg 100 mL/hr over 30 Minutes Intravenous 3 times per day 03/06/15 1620 03/06/15 1635      Objective: Filed Weights   03/06/15 1716  Weight: 55.475 kg (122 lb 4.8 oz)    Intake/Output Summary (Last 24 hours) at 03/07/15 1129 Last data filed at 03/07/15 1043  Gross per 24 hour  Intake   2052 ml  Output    500 ml  Net   1552 ml     Vitals Filed Vitals:   03/06/15 1652 03/06/15 1716 03/06/15 2102 03/07/15 0455  BP: 161/74 151/88 135/74 147/69  Pulse: 86 98 92 83  Temp: 98.1 F (36.7 C) 98.1 F (36.7 C) 98.4 F (36.9 C) 98.3 F (36.8 C)  TempSrc: Oral Oral Oral Oral  Resp: Height:   (1.626 m)    Weight:  55.475 kg (122 lb 4.8 oz)    SpO2: 98% 98% 98% 99%    Exam:  General:  Pt is alert, not in acute distress  HEENT: No icterus, No thrush, oral mucosa moist- left eye  enucleated-right eye opaque: No scleral icterus  Cardiovascular: regular rate and rhythm, S1/S2 No murmur  Respiratory: clear to auscultation bilaterally   Abdomen: Soft,  +Bowel sounds, non tender, non distended, no guarding  MSK: No LE edema, cyanosis or clubbing - necrotic ulcer on left heel plantar aspect- no discharge, +surrounding tenderness and erythema with foul smell  Data Reviewed: Basic Metabolic Panel:  Recent Labs Lab 03/04/15 1453 03/06/15 1352 03/07/15 0451  NA 134* 135 131*  K 4.0 4.2 4.0  CL 97* 99* 98*  CO2 GLUCOSE 132* 133* 95  BUN 16 28* 24*  CREATININE 1.16 1.38* 1.37*  CALCIUM 10.2 10.1 9.8   Liver Function Tests:  Recent Labs Lab 03/07/15 0451  AST 29  ALT 26  ALKPHOS 71  BILITOT 0.8  PROT 6.7  ALBUMIN 2.9*   No results for input(s): LIPASE, AMYLASE in the last 168 hours. No results for input(s): AMMONIA in the last 168 hours. CBC:  Recent Labs Lab 03/04/15 1453 03/06/15 1352 03/07/15 0451  WBC 13.1* 19.7* 14.7*  NEUTROABS 11.4* 18.0*  --   HGB 10.1* 9.3* 8.9*  HCT 31.0* 28.0* 26.7*  MCV 89.6 91.2 91.1  PLT 495* 440* 466*   Cardiac Enzymes: No results for input(s): CKTOTAL, CKMB, CKMBINDEX, TROPONINI in the last 168 hours. BNP (last 3 results) No results for input(s): BNP in the last 8760 hours.  ProBNP (last 3 results) No results for input(s): PROBNP in the last 8760 hours.  CBG: No results for input(s): GLUCAP in the last 168 hours.  Recent Results (from the past 240 hour(s))  Urine culture     Status: None (Preliminary result)   Collection Time: 03/06/15  3:31 PM  Result Value Ref Range Status   Specimen Description Urine  Final   Special Requests NONE  Final   Culture   Final    NO GROWTH < 12 HOURS Performed at [facility]    Report Status PENDING  Incomplete  Culture, blood (routine x 2)     Status: None (Preliminary result)   Collection Time: 03/06/15  6:45 PM  Result Value Ref Range Status   Specimen Description BLOOD LEFT ARM  Final   Special Requests BOTTLES DRAWN AEROBIC ONLY 9CC  Final   Culture   Final    NO GROWTH < 12 HOURS Performed at [facility]    Report Status PENDING  Incomplete  Culture, blood (routine x 2)     Status: None (Preliminary result)   Collection Time: 03/06/15  6:50 PM  Result Value Ref Range Status   Specimen Description BLOOD LEFT HAND  Final   Special Requests BOTTLES DRAWN AEROBIC ONLY 10CC  Final   Culture   Final    NO GROWTH < 12 HOURS Performed at [facility]    Report Status PENDING  Incomplete     Studies: Dg Foot Complete Left  03/06/2015   CLINICAL DATA:  Soft tissue wound to the left foot, foot pain  EXAM: LEFT FOOT - COMPLETE 3+ VIEW  COMPARISON:  03/04/2015 similar exam performed at [facility]  FINDINGS: Bones are subjectively osteopenic. No fracture or dislocation. No radiopaque foreign body. Plantar calcaneal spurring is noted. Vascular calcifications are partly visualized.  IMPRESSION: Subjective osteopenia without acute osseous abnormality.   Electronically Signed   By: Christiana Pellant M.D.   On: 03/06/2015 15:54    Scheduled Meds:  Scheduled Meds: . amLODipine  5 mg Oral Daily  . docusate sodium  100 mg Oral BID  . enoxaparin (LOVENOX) injection  40 mg Subcutaneous Q24H  . piperacillin-tazobactam (ZOSYN)  IV  3.375 g Intravenous Q8H  . vancomycin  750 mg Intravenous Q24H   Continuous Infusions: . sodium chloride 75 mL/hr at 03/07/15 1610    Time spent on care of this patient: 35 min   Timothee Gali, MD 03/07/2015, 11:29 AM  LOS: 1 day   [facility] Office  [phone] Pager - Text Page per [email or web address] If 7PM-7AM, please contact night-coverage [email or web address]
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
## 2015-03-08 ENCOUNTER — Inpatient Hospital Stay (HOSPITAL_COMMUNITY): Payer: Medicare Other

## 2015-03-08 DIAGNOSIS — R011 Cardiac murmur, unspecified: Secondary | ICD-10-CM

## 2015-03-08 LAB — CBC
HEMATOCRIT: 25.1 % — AB (ref 39.0–52.0)
HEMOGLOBIN: 8.2 g/dL — AB (ref 13.0–17.0)
MCH: 29.7 pg (ref 26.0–34.0)
MCHC: 32.7 g/dL (ref 30.0–36.0)
MCV: 90.9 fL (ref 78.0–100.0)
Platelets: 412 10*3/uL — ABNORMAL HIGH (ref 150–400)
RBC: 2.76 MIL/uL — ABNORMAL LOW (ref 4.22–5.81)
RDW: 13.6 % (ref 11.5–15.5)
WBC: 11.5 10*3/uL — AB (ref 4.0–10.5)

## 2015-03-08 LAB — BASIC METABOLIC PANEL
ANION GAP: 7 (ref 5–15)
BUN: 24 mg/dL — ABNORMAL HIGH (ref 6–20)
CALCIUM: 9.4 mg/dL (ref 8.9–10.3)
CO2: 23 mmol/L (ref 22–32)
Chloride: 104 mmol/L (ref 101–111)
Creatinine, Ser: 1.19 mg/dL (ref 0.61–1.24)
GFR, EST NON AFRICAN AMERICAN: 59 mL/min — AB (ref >60)
GLUCOSE: 99 mg/dL (ref 65–99)
POTASSIUM: 4.3 mmol/L (ref 3.5–5.1)
SODIUM: 134 mmol/L — AB (ref 135–145)

## 2015-03-08 LAB — URINE CULTURE: Culture: >=100000

## 2015-03-08 MED ORDER — PNEUMOCOCCAL VAC POLYVALENT 25 MCG/0.5ML IJ INJ
0.5000 mL | INJECTION | INTRAMUSCULAR | Status: AC
  Administered 2015-03-09: 0.5 mL via INTRAMUSCULAR
  Filled 2015-03-08 (×2): qty 0.5

## 2015-03-08 MED ORDER — INFLUENZA VAC SPLIT QUAD 0.5 ML IM SUSY
0.5000 mL | PREFILLED_SYRINGE | INTRAMUSCULAR | Status: AC
  Administered 2015-03-09: 0.5 mL via INTRAMUSCULAR
  Filled 2015-03-08 (×2): qty 0.5

## 2015-03-08 NOTE — Progress Notes (Signed)
Utilization Review Completed.[name] T9/04/2015  

## 2015-03-08 NOTE — Plan of Care (Signed)
Problem: Phase I Progression Outcomes Goal: Voiding-avoid urinary catheter unless indicated Outcome: Completed/Met Date Met:  03/08/15 Condom cath

## 2015-03-08 NOTE — Progress Notes (Signed)
  Echocardiogram 2D Echocardiogram has been performed.  Aris Everts 03/08/2015, 8:57 AM

## 2015-03-08 NOTE — Progress Notes (Signed)
[facility] Progress Note   [name]  NFA:[id number]  DOB: Mar 10, 1941  DOA: 03/06/2015 PCP: Candi Leash, MD  Brief narrative: [name] is a 74 y.o. male is legally blind presents to the hospital for left foot pain. He was here couple of days ago and was given prescriptions for Keflex and Bactrim but did not have transportation to the pharmacy to get them filled. He returns due to increasing pain in his foot. He has no complaints of fevers chills, shortness or breath, cough, sinus drainage, vomiting or diarrhea.  Found to have infected wound on his left heel with leukocytosis and has acute renal failure   Subjective: Pain in left foot- no nausea, vomiting, diarrhea or dyspnea.   Assessment/Plan: Principal Problem:  Sepsis-tachycardia, leukocytosis- gangrene left foot - Necrotic ulcer on left foot is likely the source of infection (in setting of PAD)  - X-ray of the foot is negative for osteomyelitis - Started vancomycin and Zosyn- leukocytosis nearly resolved  -  blood cultures negative  - vascular surgery feels he will need either an AKA or BKA which I have discussed with the patient and his nephew  Active Problems: PAD - right ABI suggestive of critical insufficiency- left is borderline Critical   ARF (acute renal failure)- CKD 2-3 - Cr 1.16 on 03/04/15- increased to 1.38 with an elevated in BUN as welland now improved back to 1.19 - Prerenal vs ATN - Given 500 cc bolus in ER -Hydrated slowly - Cr has improved- urine output excellent yesterday (3 L) - will stop IVF and encourage oral fluid intake  Hypertension -BP elevated- home med list mentions HCTZ and lisinopril-patient states that he does not take any medication and does not have PCP - Norvasc effectively controlling bP  Murmur - left and right sternal border- not radiating to carotids - check ECHO  Anemia - anemia panel suggestive of AOCD  Legally blind  No PCP- have asked for case management  assistance to help find PCP   Code Status:     Code Status Orders        Start     Ordered   03/06/15 1706  Full code   Continuous     03/06/15 1707     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: states to contact Friend whom he lives with-dorothy wilson [phone]- also has 7 nephews Disposition Plan: to be determined DVT prophylaxis: lovenox Consultants:vasc sx Procedures: Appt with PCP: no PCP yet Antibiotics: Anti-infectives    Start     Dose/Rate Route Frequency Ordered Stop   03/07/15 1800  vancomycin (VANCOCIN) IVPB 750 mg/150 ml premix     750 mg 150 mL/hr over 60 Minutes Intravenous Every 24 hours 03/06/15 1821     03/07/15 0000  piperacillin-tazobactam (ZOSYN) IVPB 3.375 g     3.375 g 12.5 mL/hr over 240 Minutes Intravenous Every 8 hours 03/06/15 1729     03/06/15 1730  vancomycin (VANCOCIN) IVPB 1000 mg/200 mL premix     1,000 mg 200 mL/hr over 60 Minutes Intravenous  Once 03/06/15 1703 03/06/15 1929   03/06/15 1730  piperacillin-tazobactam (ZOSYN) IVPB 3.375 g     3.375 g 100 mL/hr over 30 Minutes Intravenous  Once 03/06/15 1729 03/06/15 1858   03/06/15 1630  clindamycin (CLEOCIN) IVPB 600 mg  Status:  Discontinued     600 mg 100 mL/hr over 30 Minutes Intravenous 3 times per day 03/06/15 1620 03/06/15 1635      Objective: Filed Weights   03/06/15 1716  Weight:  55.475 kg (122 lb 4.8 oz)    Intake/Output Summary (Last 24 hours) at 03/08/15 1048 Last data filed at 03/08/15 0914  Gross per 24 hour  Intake 3498.4 ml  Output   3400 ml  Net   98.4 ml     Vitals Filed Vitals:   03/07/15 0455 03/07/15 1340 03/07/15 2032 03/08/15 0508  BP: 147/69 139/64 82/49 134/77  Pulse: 83 81 91 79  Temp: 98.3 F (36.8 C) 98.2 F (36.8 C) 99.6 F (37.6 C) 98.8 F (37.1 C)  TempSrc: Oral Oral Oral Oral  Resp: Height:      Weight:      SpO2: 99% 99% 97% 100%    Exam:  General:  Pt is alert, not in acute distress  HEENT: No icterus, No thrush, oral mucosa  moist- left eye  enucleated-right eye opaque: No scleral icterus  Cardiovascular: regular rate and rhythm, S1/S2 No murmur  Respiratory: clear to auscultation bilaterally   Abdomen: Soft, +Bowel sounds, non tender, non distended, no guarding  MSK: No LE edema, cyanosis or clubbing - necrotic ulcer on left heel plantar aspect- no discharge, +surrounding tenderness - necrosis increasing in size-  foul smell  Data Reviewed: Basic Metabolic Panel:  Recent Labs Lab 03/04/15 1453 03/06/15 1352 03/07/15 0451 03/08/15 0456  NA 134* 135 131* 134*  K 4.0 4.2 4.0 4.3  CL 97* 99* 98* 104  CO2 GLUCOSE 132* 133* 95 99  BUN 16 28* 24* 24*  CREATININE 1.16 1.38* 1.37* 1.19  CALCIUM 10.2 10.1 9.8 9.4   Liver Function Tests:  Recent Labs Lab 03/07/15 0451  AST 29  ALT 26  ALKPHOS 71  BILITOT 0.8  PROT 6.7  ALBUMIN 2.9*   No results for input(s): LIPASE, AMYLASE in the last 168 hours. No results for input(s): AMMONIA in the last 168 hours. CBC:  Recent Labs Lab 03/04/15 1453 03/06/15 1352 03/07/15 0451 03/08/15 0456  WBC 13.1* 19.7* 14.7* 11.5*  NEUTROABS 11.4* 18.0*  --   --   HGB 10.1* 9.3* 8.9* 8.2*  HCT 31.0* 28.0* 26.7* 25.1*  MCV 89.6 91.2 91.1 90.9  PLT 495* 440* 466* 412*   Cardiac Enzymes: No results for input(s): CKTOTAL, CKMB, CKMBINDEX, TROPONINI in the last 168 hours. BNP (last 3 results) No results for input(s): BNP in the last 8760 hours.  ProBNP (last 3 results) No results for input(s): PROBNP in the last 8760 hours.  CBG: No results for input(s): GLUCAP in the last 168 hours.  Recent Results (from the past 240 hour(s))  Urine culture     Status: None (Preliminary result)   Collection Time: 03/06/15  3:31 PM  Result Value Ref Range Status   Specimen Description Urine  Final   Special Requests NONE  Final   Culture   Final    NO GROWTH < 24 HOURS Performed at [facility]    Report Status PENDING  Incomplete  Culture,  blood (routine x 2)     Status: None (Preliminary result)   Collection Time: 03/06/15  6:45 PM  Result Value Ref Range Status   Specimen Description BLOOD LEFT ARM  Final   Special Requests BOTTLES DRAWN AEROBIC ONLY 9CC  Final   Culture   Final    NO GROWTH < 12 HOURS Performed at [facility]    Report Status PENDING  Incomplete  Culture, blood (routine x 2)     Status: None (  Preliminary result)   Collection Time: 03/06/15  6:50 PM  Result Value Ref Range Status   Specimen Description BLOOD LEFT HAND  Final   Special Requests BOTTLES DRAWN AEROBIC ONLY 10CC  Final   Culture   Final    NO GROWTH < 12 HOURS Performed at [facility]    Report Status PENDING  Incomplete     Studies: Dg Foot Complete Left  03/06/2015   CLINICAL DATA:  Soft tissue wound to the left foot, foot pain  EXAM: LEFT FOOT - COMPLETE 3+ VIEW  COMPARISON:  03/04/2015 similar exam performed at [facility]  FINDINGS: Bones are subjectively osteopenic. No fracture or dislocation. No radiopaque foreign body. Plantar calcaneal spurring is noted. Vascular calcifications are partly visualized.  IMPRESSION: Subjective osteopenia without acute osseous abnormality.   Electronically Signed   By: Christiana Pellant M.D.   On: 03/06/2015 15:54    Scheduled Meds:  Scheduled Meds: . amLODipine  5 mg Oral Daily  . docusate sodium  100 mg Oral BID  . enoxaparin (LOVENOX) injection  40 mg Subcutaneous Q24H  . feeding supplement (ENSURE ENLIVE)  237 mL Oral TID BM  . [START ON 03/09/2015] Influenza vac split quadrivalent PF  0.5 mL Intramuscular Tomorrow-1000  . piperacillin-tazobactam (ZOSYN)  IV  3.375 g Intravenous Q8H  . [START ON 03/09/2015] pneumococcal 23 valent vaccine  0.5 mL Intramuscular Tomorrow-1000  . vancomycin  750 mg Intravenous Q24H   Continuous Infusions:    Time spent on care of this patient: 35 min   Aithana Kushner, MD 03/08/2015, 10:48 AM  LOS: 2 days   [facility] Office  [phone] Pager - Text Page per [email or web address] If 7PM-7AM, please contact night-coverage [email or web address]

## 2015-03-08 NOTE — Progress Notes (Signed)
Pt. Family contact person is Boyce Medici pt. Niece. Updated in pt. Contact information.

## 2015-03-08 NOTE — Evaluation (Signed)
Physical Therapy Evaluation Patient Details Name: [name] MRN: [id number] DOB: 12/26/1940 Today's Date: 03/08/2015   History of Present Illness  74 yo male admitted with sepsis, L foot pain with ulcer.   Clinical Impression  On eval, pt required Min assist for mobility-stood at EOB with RW for ~10 seconds. Pt reported significant pain with L LE dependency and any attempts at [facility]. Pt maintained NWB L LE mostly due to pain. Should be able to d/c home with [facility] as [facility] as pain control improves and pt is able to progress mobility.     Follow Up Recommendations Home health PT;Supervision/Assistance - 24 hour (depending on progress. May need to consider SNF placement if pt is unable to progress mobility)    Equipment Recommendations  Rolling walker with 5" wheels    Recommendations for Other Services       Precautions / Restrictions Precautions Precautions: Fall Restrictions Weight Bearing Restrictions: No      Mobility  Bed Mobility Overal bed mobility: Needs Assistance Bed Mobility: Supine to Sit;Sit to Supine     Supine to sit: HOB elevated;Min assist Sit to supine: HOB elevated;Min assist   General bed mobility comments: Increased time. Assist for lines, L LE.   Transfers Overall transfer level: Needs assistance Equipment used: Rolling walker (2 wheeled) Transfers: Sit to/from Stand Sit to Stand: Mod assist;From elevated surface         General transfer comment: Assist to rise, stabilize, control descent, position hands on walker. Multimodal cues for safety, technique,L foot placement. Pt able to stand ~10 seconds at EOB with RW and NWB L LE.  Ambulation/Gait             General Gait Details: NT-pt unwilling/unable due to pain at this time  Stairs            Wheelchair Mobility    Modified Rankin (Stroke Patients Only)       Balance Overall balance assessment: Needs assistance         Standing balance support: Bilateral upper extremity  supported;During functional activity Standing balance-Leahy Scale: Poor                               Pertinent Vitals/Pain Pain Assessment: Faces Faces Pain Scale: Hurts whole lot Pain Location: L foot with depedency Pain Descriptors / Indicators: Sore;Aching;Tender    Home Living Family/patient expects to be discharged to:: Private residence Living Arrangements:  ("friend")   Type of Home: Apartment Home Access: Stairs to enter   Secretary/administrator of Steps: 1 Home Layout: One level Home Equipment: Cane - single point      Prior Function Level of Independence: Independent with assistive device(s)               Hand Dominance        Extremity/Trunk Assessment               Lower Extremity Assessment: Generalized weakness;LLE deficits/detail   LLE Deficits / Details: L heel ulcer-malodorous, tender to touch, necrotic tissue  Cervical / Trunk Assessment: Normal  Communication   Communication: No difficulties  Cognition Arousal/Alertness: Awake/alert Behavior During Therapy: WFL for tasks assessed/performed Overall Cognitive Status: Within Functional Limits for tasks assessed                      General Comments      Exercises        Assessment/Plan  PT Assessment Patient needs continued PT services  PT Diagnosis Difficulty walking;Abnormality of gait;Generalized weakness;Acute pain   PT Problem List Decreased strength;Decreased range of motion;Decreased activity tolerance;Decreased balance;Decreased mobility;Pain;Decreased knowledge of use of DME  PT Treatment Interventions DME instruction;Gait training;Functional mobility training;Therapeutic activities;Patient/family education;Balance training;Therapeutic exercise   PT Goals (Current goals can be found in the Care Plan section) Acute Rehab PT Goals Patient Stated Goal: less pain PT Goal Formulation: With patient Time For Goal Achievement: 03/22/15 Potential to  Achieve Goals: Fair    Frequency Min 3X/week   Barriers to discharge        Co-evaluation               End of Session   Activity Tolerance: Patient limited by pain Patient left: in bed;with call bell/phone within reach;with bed alarm set           Time: 1026-1040 PT Time Calculation (min) (ACUTE ONLY): 14 min   Charges:   PT Evaluation $Initial PT Evaluation Tier I: 1 Procedure     PT G Codes:        Rebeca Alert, MPT Pager: [phone]

## 2015-03-09 DIAGNOSIS — I96 Gangrene, not elsewhere classified: Secondary | ICD-10-CM

## 2015-03-09 DIAGNOSIS — A419 Sepsis, unspecified organism: Secondary | ICD-10-CM | POA: Diagnosis not present

## 2015-03-09 DIAGNOSIS — H540 Blindness, both eyes: Secondary | ICD-10-CM

## 2015-03-09 LAB — BASIC METABOLIC PANEL
ANION GAP: 9 (ref 5–15)
BUN: 26 mg/dL — ABNORMAL HIGH (ref 6–20)
CO2: 27 mmol/L (ref 22–32)
Calcium: 10.1 mg/dL (ref 8.9–10.3)
Chloride: 99 mmol/L — ABNORMAL LOW (ref 101–111)
Creatinine, Ser: 1.27 mg/dL — ABNORMAL HIGH (ref 0.61–1.24)
GFR calc Af Amer: >60 mL/min (ref >60)
GFR, EST NON AFRICAN AMERICAN: 55 mL/min — AB (ref >60)
GLUCOSE: 101 mg/dL — AB (ref 65–99)
POTASSIUM: 4.5 mmol/L (ref 3.5–5.1)
SODIUM: 135 mmol/L (ref 135–145)

## 2015-03-09 LAB — VANCOMYCIN, TROUGH: VANCOMYCIN TR: 11 ug/mL (ref 10.0–20.0)

## 2015-03-09 MED ORDER — SODIUM CHLORIDE 0.9 % IV SOLN
INTRAVENOUS | Status: DC
  Administered 2015-03-09: 1000 mL via INTRAVENOUS
  Administered 2015-03-09 – 2015-03-18 (×11): via INTRAVENOUS

## 2015-03-09 MED ORDER — VANCOMYCIN HCL IN DEXTROSE 1-5 GM/200ML-% IV SOLN
1000.0000 mg | INTRAVENOUS | Status: DC
  Administered 2015-03-10 – 2015-03-16 (×7): 1000 mg via INTRAVENOUS
  Filled 2015-03-09 (×8): qty 200

## 2015-03-09 NOTE — Progress Notes (Addendum)
   Daily Progress Note  Assessment/Planning: L heel gangrene   Offered L BKA vs AKA  Pt again appears to be confused and incapable of decision making  Discussed the issues and options with his niece.  She is going to arrange a family meeting to discuss their options  Let me know if they want to proceed  Will not be able to round at [facility] for the rest of the week due to OR time demands  Subjective    Left foot still painful  Objective Filed Vitals:   03/08/15 1051 03/08/15 1255 03/08/15 2147 03/09/15 0553  BP: 124/88 128/73 145/78 153/81  Pulse: 75 77 90 86  Temp:  98.3 F (36.8 C) 98.9 F (37.2 C) 98.4 F (36.9 C)  TempSrc:  Oral Oral Oral  Resp:  Height:      Weight:      SpO2:  99% 100% 98%    Intake/Output Summary (Last 24 hours) at 03/09/15 1224 Last data filed at 03/09/15 1212  Gross per 24 hour  Intake   1760 ml  Output   4325 ml  Net  -2565 ml    VASC  L heel gangrene, desquamating skin around the gangrene  Laboratory CBC    Component Value Date/Time   WBC 11.5* 03/08/2015 0456   HGB 8.2* 03/08/2015 0456   HCT 25.1* 03/08/2015 0456   PLT 412* 03/08/2015 0456    BMET    Component Value Date/Time   NA 135 03/09/2015 0410   K 4.5 03/09/2015 0410   CL 99* 03/09/2015 0410   CO2 27 03/09/2015 0410   GLUCOSE 101* 03/09/2015 0410   BUN 26* 03/09/2015 0410   CREATININE 1.27* 03/09/2015 0410   CALCIUM 10.1 03/09/2015 0410   CALCIUM 10.1 01/19/2011 2115   GFRNONAA 55* 03/09/2015 0410   GFRAA >60 03/09/2015 0410    Leonides Sake, MD [facility] Office: [phone] Pager: [phone]  03/09/2015, 12:24 PM

## 2015-03-09 NOTE — Progress Notes (Signed)
ANTIBIOTIC CONSULT NOTE - FOLLOW UP  Pharmacy Consult for Vancomycin Indication: rule out sepsis, left foot gangrene  See previous progress note from Lynann Beaver, PharmD for full details.  Vancomycin trough = 11 mcg/ml which is subtherapeutic.  Today's dose of  has been given.  Plan:  Starting tomorrow at 4pm, increase vancomycin to 1g IV q24h  Recheck trough at new steady state if continues  Monitor SCr closely  Loralee Pacas, PharmD, BCPS Pager: [phone] 03/09/2015 6:06 PM

## 2015-03-09 NOTE — Progress Notes (Signed)
ANTIBIOTIC CONSULT NOTE - FOLLOW UP  Pharmacy Consult for Vancomycin, Zosyn Indication: rule out sepsis, left foot gangrene  No Known Allergies  Patient Measurements: Height:  (162.6 cm) Weight: 122 lb 4.8 oz (55.475 kg) IBW/kg (Calculated) : 59.2  Vital Signs: Temp: 98.4 F (36.9 C) (09/12 0553) Temp Source: Oral (09/12 0553) BP: 153/81 mmHg (09/12 0553) Pulse Rate: 86 (09/12 0553) Intake/Output from previous day: 09/11 0701 - 09/12 0700 In: 2050 [P.O.:1800; IV Piggyback:250] Out: 4575 [Urine:4575]  Labs:  Recent Labs  03/06/15 1352 03/07/15 0451 03/08/15 0456 03/09/15 0410  WBC 19.7* 14.7* 11.5*  --   HGB 9.3* 8.9* 8.2*  --   PLT 440* 466* 412*  --   CREATININE 1.38* 1.37* 1.19 1.27*   Estimated Creatinine Clearance: 41.3 mL/min (by C-G formula based on Cr of 1.27). No results for input(s): VANCOTROUGH, VANCOPEAK, VANCORANDOM, GENTTROUGH, GENTPEAK, GENTRANDOM, TOBRATROUGH, TOBRAPEAK, TOBRARND, AMIKACINPEAK, AMIKACINTROU, AMIKACIN in the last 72 hours.    Assessment: 74 y.o. 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 He was given prescriptions for Keflex and Bactrim in ED several days ago but was unable to get prescriptions filled. He now meets sepsis criteria with tachycardia and leukocytosis with necrotic foot ulcer. Foot X-ray negative for osteo, but Vascular surgery recommends BKA vs AKA, pending patient consent. Pharmacy is consulted to dose vancomycin/Zosyn for sepsis.  Anti-infectives: 9/9 Clindamycin x 1 9/9 >> Zosyn >> 9/9 >> Vancomycin >>   Micro: 9/9 Blood cxt x2: ngtd 9/9 Urine cxt: >100k Aerococcus Urinae  Today, 03/09/2015:  Afebrile  WBC improved, 11.5  SCr increased to 1.27, with CrCl ~41 ml/min  Vancomycin trough level pending  Goal of Therapy:  Vancomycin trough level 15-20 mcg/ml Appropriate abx dosing, eradication of infection.   Plan:   Continue Zosyn 3.375g IV Q8H infused over 4hrs.  Continue  Vancomycin 750 mg IV q24h.  Measure Vanc trough at steady state.  Follow up renal fxn, culture results, and clinical course.  Lynann Beaver PharmD, BCPS Pager [phone] 03/09/2015 9:33 AM

## 2015-03-09 NOTE — Care Management Important Message (Signed)
Important Message  Patient Details  Name: [name] MRN: [id number] Date of Birth: December 30, 1940   Medicare Important Message Given:  Yes-second notification given    [name] 03/09/2015, 12:02 PMImportant Message  Patient Details  Name: [name] MRN: [id number] Date of Birth: 1940/10/26   Medicare Important Message Given:  Yes-second notification given    [name] 03/09/2015, 12:01 PM

## 2015-03-09 NOTE — Progress Notes (Signed)
[facility] Progress Note   [name]  MWU:[id number]  DOB: 23-Jun-1941  DOA: 03/06/2015 PCP: Candi Leash, MD  Brief narrative: [name] is a 74 y.o. male is legally blind presents to the hospital for left foot pain. He was here couple of days ago and was given prescriptions for Keflex and Bactrim but did not have transportation to the pharmacy to get them filled. He returns due to increasing pain in his foot. He has no complaints of fevers chills, shortness or breath, cough, sinus drainage, vomiting or diarrhea.  Found to have infected wound on his left heel with leukocytosis and has acute renal failure   Subjective: No complaints of pain, nausea, vomiting, diarrhea or shortness of breath  Assessment/Plan: Principal Problem:  Sepsis-tachycardia, leukocytosis- gangrene left foot - Necrotic ulcer on left foot is likely the source of infection (in setting of PAD)  - X-ray of the foot is negative for osteomyelitis - Started vancomycin and Zosyn- leukocytosis nearly resolved  -  blood cultures negative  - vascular surgery feels he will need either an AKA or BKA which I have discussed with the patient and his nephew  Active Problems: PAD - right ABI suggestive of critical insufficiency- left is borderline Critical   ARF (acute renal failure)- CKD 2-3 - Cr 1.16 on 03/04/15- increased to 1.38 with an elevated in BUN as welland now improved back to 1.19 - Prerenal vs ATN - Given 500 cc bolus in ER -Hydrated slowly - Cr has improved- urine output proved significantly - 9/11- stopped IVF and encouraged oral fluid intake- but BUN rising again-we'll resume IV fluids  Hypertension -BP elevated- home med list mentions HCTZ and lisinopril-patient states that he does not take any medication and does not have PCP - Norvasc effectively controlling bP  Murmur - left and right sternal border- not radiating to carotids -ECHO- reveals normal EF, grade 1 diastolic dysfunction, mild  LVH  Anemia - anemia panel suggestive of AOCD  Legally blind  No PCP- have asked for case management assistance to help find PCP   Code Status:     Code Status Orders        Start     Ordered   03/06/15 1706  Full code   Continuous     03/06/15 1707     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, research (physical sciences) in chart  Disposition Plan: to be determined- family would like to take him home with them but he may need SNF DVT prophylaxis: lovenox Consultants:vasc sx Procedures: Appt with PCP: no PCP yet Antibiotics: Anti-infectives    Start     Dose/Rate Route Frequency Ordered Stop   03/07/15 1800  vancomycin (VANCOCIN) IVPB 750 mg/150 ml premix     750 mg 150 mL/hr over 60 Minutes Intravenous Every 24 hours 03/06/15 1821     03/07/15 0000  piperacillin-tazobactam (ZOSYN) IVPB 3.375 g     3.375 g 12.5 mL/hr over 240 Minutes Intravenous Every 8 hours 03/06/15 1729     03/06/15 1730  vancomycin (VANCOCIN) IVPB 1000 mg/200 mL premix     1,000 mg 200 mL/hr over 60 Minutes Intravenous  Once 03/06/15 1703 03/06/15 1929   03/06/15 1730  piperacillin-tazobactam (ZOSYN) IVPB 3.375 g     3.375 g 100 mL/hr over 30 Minutes Intravenous  Once 03/06/15 1729 03/06/15 1858   03/06/15 1630  clindamycin (CLEOCIN) IVPB 600 mg  Status:  Discontinued     600 mg 100 mL/hr over 30 Minutes Intravenous 3 times per day 03/06/15  1620 03/06/15 1635      Objective: Filed Weights   03/06/15 1716  Weight: 55.475 kg (122 lb 4.8 oz)    Intake/Output Summary (Last 24 hours) at 03/09/15 1651 Last data filed at 03/09/15 1212  Gross per 24 hour  Intake    990 ml  Output   3575 ml  Net  -2585 ml     Vitals Filed Vitals:   03/08/15 1255 03/08/15 2147 03/09/15 0553 03/09/15 1456  BP: 128/73 145/78 153/81 137/73  Pulse: 77 90 86 86  Temp: 98.3 F (36.8 C) 98.9 F (37.2 C) 98.4 F (36.9 C) 98.1 F (36.7 C)  TempSrc: Oral Oral Oral Oral  Resp: 18 18 16 16   Height:      Weight:      SpO2: 99% 100% 98%  100%    Exam:  General:  Pt is alert, not in acute distress  HEENT: No icterus, No thrush, oral mucosa moist- left eye  enucleated-right eye opaque: No scleral icterus  Cardiovascular: regular rate and rhythm, S1/S2 No murmur  Respiratory: clear to auscultation bilaterally   Abdomen: Soft, +Bowel sounds, non tender, non distended, no guarding  MSK: No LE edema, cyanosis or clubbing - necrotic ulcer on left heel plantar aspect- no discharge, +surrounding tenderness - necrosis increasing in size-  foul smell  Data Reviewed: Basic Metabolic Panel:  Recent Labs Lab 03/04/15 1453 03/06/15 1352 03/07/15 0451 03/08/15 0456 03/09/15 0410  NA 134* 135 131* 134* 135  K 4.0 4.2 4.0 4.3 4.5  CL 97* 99* 98* 104 99*  CO2 26 26 23 23 27   GLUCOSE 132* 133* 95 99 101*  BUN 16 28* 24* 24* 26*  CREATININE 1.16 1.38* 1.37* 1.19 1.27*  CALCIUM 10.2 10.1 9.8 9.4 10.1   Liver Function Tests:  Recent Labs Lab 03/07/15 0451  AST 29  ALT 26  ALKPHOS 71  BILITOT 0.8  PROT 6.7  ALBUMIN 2.9*   No results for input(s): LIPASE, AMYLASE in the last 168 hours. No results for input(s): AMMONIA in the last 168 hours. CBC:  Recent Labs Lab 03/04/15 1453 03/06/15 1352 03/07/15 0451 03/08/15 0456  WBC 13.1* 19.7* 14.7* 11.5*  NEUTROABS 11.4* 18.0*  --   --   HGB 10.1* 9.3* 8.9* 8.2*  HCT 31.0* 28.0* 26.7* 25.1*  MCV 89.6 91.2 91.1 90.9  PLT 495* 440* 466* 412*   Cardiac Enzymes: No results for input(s): CKTOTAL, CKMB, CKMBINDEX, TROPONINI in the last 168 hours. BNP (last 3 results) No results for input(s): BNP in the last 8760 hours.  ProBNP (last 3 results) No results for input(s): PROBNP in the last 8760 hours.  CBG: No results for input(s): GLUCAP in the last 168 hours.  Recent Results (from the past 240 hour(s))  Urine culture     Status: None   Collection Time: 03/06/15  3:31 PM  Result Value Ref Range Status   Specimen Description Urine  Final   Special Requests NONE   Final   Culture   Final    >=100,000 COLONIES/mL AEROCOCCUS URINAE Standardized susceptibility testing for this organism is not available. Performed at [facility]    Report Status 03/08/2015 FINAL  Final  Culture, blood (routine x 2)     Status: None (Preliminary result)   Collection Time: 03/06/15  6:45 PM  Result Value Ref Range Status   Specimen Description BLOOD LEFT ARM  Final   Special Requests BOTTLES DRAWN AEROBIC ONLY 9CC  Final   Culture  Final    NO GROWTH 3 DAYS Performed at [facility]    Report Status PENDING  Incomplete  Culture, blood (routine x 2)     Status: None (Preliminary result)   Collection Time: 03/06/15  6:50 PM  Result Value Ref Range Status   Specimen Description BLOOD LEFT HAND  Final   Special Requests BOTTLES DRAWN AEROBIC ONLY 10CC  Final   Culture   Final    NO GROWTH 3 DAYS Performed at [facility]    Report Status PENDING  Incomplete     Studies: No results found.  Scheduled Meds:  Scheduled Meds: . amLODipine  5 mg Oral Daily  . docusate sodium  100 mg Oral BID  . enoxaparin (LOVENOX) injection  40 mg Subcutaneous Q24H  . feeding supplement (ENSURE ENLIVE)  237 mL Oral TID BM  . piperacillin-tazobactam (ZOSYN)  IV  3.375 g Intravenous Q8H  . vancomycin  750 mg Intravenous Q24H   Continuous Infusions:    Time spent on care of this patient: 35 min   Tawsha Terrero, MD 03/09/2015, 4:51 PM  LOS: 3 days   [facility] Office  [phone] Pager - Text Page per [email or web address] If 7PM-7AM, please contact night-coverage [email or web address]

## 2015-03-10 LAB — BASIC METABOLIC PANEL
ANION GAP: 8 (ref 5–15)
BUN: 22 mg/dL — ABNORMAL HIGH (ref 6–20)
CALCIUM: 9.9 mg/dL (ref 8.9–10.3)
CO2: 25 mmol/L (ref 22–32)
CREATININE: 1.26 mg/dL — AB (ref 0.61–1.24)
Chloride: 100 mmol/L — ABNORMAL LOW (ref 101–111)
GFR, EST NON AFRICAN AMERICAN: 55 mL/min — AB (ref >60)
GLUCOSE: 107 mg/dL — AB (ref 65–99)
Potassium: 4.4 mmol/L (ref 3.5–5.1)
Sodium: 133 mmol/L — ABNORMAL LOW (ref 135–145)

## 2015-03-10 LAB — CBC
HCT: 26.6 % — ABNORMAL LOW (ref 39.0–52.0)
Hemoglobin: 8.7 g/dL — ABNORMAL LOW (ref 13.0–17.0)
MCH: 29.8 pg (ref 26.0–34.0)
MCHC: 32.7 g/dL (ref 30.0–36.0)
MCV: 91.1 fL (ref 78.0–100.0)
PLATELETS: 454 10*3/uL — AB (ref 150–400)
RBC: 2.92 MIL/uL — ABNORMAL LOW (ref 4.22–5.81)
RDW: 13.4 % (ref 11.5–15.5)
WBC: 13.2 10*3/uL — AB (ref 4.0–10.5)

## 2015-03-10 LAB — C DIFFICILE QUICK SCREEN W PCR REFLEX
C DIFFICLE (CDIFF) ANTIGEN: NEGATIVE
C Diff interpretation: NEGATIVE
C Diff toxin: NEGATIVE

## 2015-03-10 NOTE — Progress Notes (Addendum)
[facility] Progress Note   [name]  WJX:[id number]  DOB: Oct 24, 1940  DOA: 03/06/2015 PCP: Candi Leash, MD  Brief narrative: [name] is a 74 y.o. male is legally blind presents to the hospital for left foot pain. He was here couple of days ago and was given prescriptions for Keflex and Bactrim but did not have transportation to the pharmacy to get them filled. He returns due to increasing pain in his foot. He has no complaints of fevers chills, shortness or breath, cough, sinus drainage, vomiting or diarrhea.  Found to have infected wound on his left heel with leukocytosis and has acute renal failure   Subjective: [place] of pain in his heel nausea, vomiting, diarrhea or shortness of breath  Assessment/Plan: Principal Problem:  Sepsis-tachycardia, leukocytosis- gangrene left foot - Necrotic ulcer on left foot is likely the source of infection (in setting of PAD)  - X-ray of the foot is negative for osteomyelitis - Started vancomycin and Zosyn- leukocytosis resolved but rising again today -  blood cultures negative  - vascular surgery feels he will need either an AKA or BKA which I have discussed with the patient  -Surgery feels that the patient is not competent to make an appropriate decision in regards to surgery-His niece Leta Jungling told Dr. Imogene Burn yesterday that she will arrange a family meeting and discuss whether or not they feel he needs to have surgery-I have left a message for her today and I'm waiting for a call back  Active Problems: PAD - right ABI suggestive of critical insufficiency- left is borderline Critical  Urine culture >=100,000 COLONIES/mL AEROCOCCUS URINAE  - d/w ID, likely contaminant    ARF (acute renal failure)- CKD 2-3 - Cr 1.16 on 03/04/15- increased to 1.38 with an elevated in BUN as well and then improved back to 1.19 - Prerenal vs ATN - Given 500 cc bolus in ER -Hydrated slowly - Cr improved- urine output proved significantly - 9/11- stopped  IVF and encouraged oral fluid intake- but BUN rising again-  resumed IV fluids- good urine output yesterday 2000 mL -may be developing ATN now secondary to sepsis?  Hypertension -BP elevated- home med list mentions HCTZ and lisinopril-patient states that he does not take any medication and does not have PCP - Norvasc started - will avoid tight control of blood pressure as he may become septic  Murmur - left  sternal border radiating to carotids -ECHO- reveals normal EF, grade 1 diastolic dysfunction, mild LVH  Anemia - anemia panel suggestive of AOCD  Legally blind  No PCP- have asked for case management assistance to help find PCP   Code Status:     Code Status Orders        Start     Ordered   03/06/15 1706  Full code   Continuous     03/06/15 1707     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: Marcy Mathis-number in chart  Disposition Plan: to be determined- family would like to take him home with them but he may need SNF DVT prophylaxis: lovenox Consultants:vasc sx Procedures: Appt with PCP: no PCP yet Antibiotics: Anti-infectives    Start     Dose/Rate Route Frequency Ordered Stop   03/10/15 1600  vancomycin (VANCOCIN) IVPB 1000 mg/200 mL premix     1,000 mg 200 mL/hr over 60 Minutes Intravenous Every 24 hours 03/09/15 1806     03/07/15 1800  vancomycin (VANCOCIN) IVPB 750 mg/150 ml premix  Status:  Discontinued     750 mg  150 mL/hr over 60 Minutes Intravenous Every 24 hours 03/06/15 1821 03/09/15 1806   03/07/15 0000  piperacillin-tazobactam (ZOSYN) IVPB 3.375 g     3.375 g 12.5 mL/hr over 240 Minutes Intravenous Every 8 hours 03/06/15 1729     03/06/15 1730  vancomycin (VANCOCIN) IVPB 1000 mg/200 mL premix     1,000 mg 200 mL/hr over 60 Minutes Intravenous  Once 03/06/15 1703 03/06/15 1929   03/06/15 1730  piperacillin-tazobactam (ZOSYN) IVPB 3.375 g     3.375 g 100 mL/hr over 30 Minutes Intravenous  Once 03/06/15 1729 03/06/15 1858   03/06/15 1630  clindamycin (CLEOCIN)  IVPB 600 mg  Status:  Discontinued     600 mg 100 mL/hr over 30 Minutes Intravenous 3 times per day 03/06/15 1620 03/06/15 1635      Objective: Filed Weights   03/06/15 1716  Weight: 55.475 kg (122 lb 4.8 oz)    Intake/Output Summary (Last 24 hours) at 03/10/15 1524 Last data filed at 03/10/15 1041  Gross per 24 hour  Intake 2012.5 ml  Output   2050 ml  Net  -37.5 ml     Vitals Filed Vitals:   03/09/15 1955 03/09/15 2023 03/10/15 0554 03/10/15 1235  BP:  150/75 133/56 150/67  Pulse: 108 97 83 88  Temp:  98.1 F (36.7 C) 98.2 F (36.8 C) 98.1 F (36.7 C)  TempSrc:  Oral Oral Oral  Resp:  Height:      Weight:      SpO2:  100% 100% 100%    Exam:  General:  Pt is alert, not in acute distress  HEENT: No icterus, No thrush, oral mucosa moist- left eye  enucleated-right eye opaque: No scleral icterus  Cardiovascular: regular rate and rhythm, S1/S2 No murmur  Respiratory: clear to auscultation bilaterally   Abdomen: Soft, +Bowel sounds, non tender, non distended, no guarding  MSK: No LE edema, cyanosis or clubbing - large area of necrosis on left heel plantar aspect- no discharge, +surrounding tenderness - necrosis increased in size since admission-  foul smell  Data Reviewed: Basic Metabolic Panel:  Recent Labs Lab 03/06/15 1352 03/07/15 0451 03/08/15 0456 03/09/15 0410 03/10/15 0447  NA 135 131* 134* 135 133*  K 4.2 4.0 4.3 4.5 4.4  CL 99* 98* 104 99* 100*  CO2 GLUCOSE 133* 95 99 101* 107*  BUN 28* 24* 24* 26* 22*  CREATININE 1.38* 1.37* 1.19 1.27* 1.26*  CALCIUM 10.1 9.8 9.4 10.1 9.9   Liver Function Tests:  Recent Labs Lab 03/07/15 0451  AST 29  ALT 26  ALKPHOS 71  BILITOT 0.8  PROT 6.7  ALBUMIN 2.9*   No results for input(s): LIPASE, AMYLASE in the last 168 hours. No results for input(s): AMMONIA in the last 168 hours. CBC:  Recent Labs Lab 03/04/15 1453 03/06/15 1352 03/07/15 0451 03/08/15 0456  03/10/15 0447  WBC 13.1* 19.7* 14.7* 11.5* 13.2*  NEUTROABS 11.4* 18.0*  --   --   --   HGB 10.1* 9.3* 8.9* 8.2* 8.7*  HCT 31.0* 28.0* 26.7* 25.1* 26.6*  MCV 89.6 91.2 91.1 90.9 91.1  PLT 495* 440* 466* 412* 454*   Cardiac Enzymes: No results for input(s): CKTOTAL, CKMB, CKMBINDEX, TROPONINI in the last 168 hours. BNP (last 3 results) No results for input(s): BNP in the last 8760 hours.  ProBNP (last 3 results) No results for input(s): PROBNP in the last 8760 hours.  CBG: No results for  input(s): GLUCAP in the last 168 hours.  Recent Results (from the past 240 hour(s))  Urine culture     Status: None   Collection Time: 03/06/15  3:31 PM  Result Value Ref Range Status   Specimen Description Urine  Final   Special Requests NONE  Final   Culture   Final    >=100,000 COLONIES/mL AEROCOCCUS URINAE Standardized susceptibility testing for this organism is not available. Performed at [facility]    Report Status 03/08/2015 FINAL  Final  Culture, blood (routine x 2)     Status: None (Preliminary result)   Collection Time: 03/06/15  6:45 PM  Result Value Ref Range Status   Specimen Description BLOOD LEFT ARM  Final   Special Requests BOTTLES DRAWN AEROBIC ONLY 9CC  Final   Culture   Final    NO GROWTH 4 DAYS Performed at [facility]    Report Status PENDING  Incomplete  Culture, blood (routine x 2)     Status: None (Preliminary result)   Collection Time: 03/06/15  6:50 PM  Result Value Ref Range Status   Specimen Description BLOOD LEFT HAND  Final   Special Requests BOTTLES DRAWN AEROBIC ONLY 10CC  Final   Culture   Final    NO GROWTH 4 DAYS Performed at [facility]    Report Status PENDING  Incomplete  C difficile quick scan w PCR reflex     Status: None   Collection Time: 03/10/15 11:02 AM  Result Value Ref Range Status   C Diff antigen NEGATIVE NEGATIVE Final   C Diff toxin NEGATIVE NEGATIVE Final   C Diff interpretation Negative for toxigenic  C. difficile  Final     Studies: No results found.  Scheduled Meds:  Scheduled Meds: . amLODipine  5 mg Oral Daily  . docusate sodium  100 mg Oral BID  . enoxaparin (LOVENOX) injection  40 mg Subcutaneous Q24H  . feeding supplement (ENSURE ENLIVE)  237 mL Oral TID BM  . piperacillin-tazobactam (ZOSYN)  IV  3.375 g Intravenous Q8H  . vancomycin  1,000 mg Intravenous Q24H   Continuous Infusions: . sodium chloride 75 mL/hr at 03/10/15 1018    Time spent on care of this patient: 35 min   Stephannie Broner, MD 03/10/2015, 3:24 PM  LOS: 4 days   [facility] Office  [phone] Pager - Text Page per [email or web address] If 7PM-7AM, please contact night-coverage [email or web address]

## 2015-03-10 NOTE — Plan of Care (Signed)
Problem: Phase I Progression Outcomes Goal: Other Phase I Outcomes/Goals Outcome: Progressing Patient with several loose stools during the night.

## 2015-03-11 LAB — BASIC METABOLIC PANEL
Anion gap: 9 (ref 5–15)
BUN: 20 mg/dL (ref 6–20)
CHLORIDE: 100 mmol/L — AB (ref 101–111)
CO2: 25 mmol/L (ref 22–32)
CREATININE: 1.22 mg/dL (ref 0.61–1.24)
Calcium: 9.7 mg/dL (ref 8.9–10.3)
GFR calc non Af Amer: 57 mL/min — ABNORMAL LOW (ref >60)
Glucose, Bld: 100 mg/dL — ABNORMAL HIGH (ref 65–99)
POTASSIUM: 4.1 mmol/L (ref 3.5–5.1)
Sodium: 134 mmol/L — ABNORMAL LOW (ref 135–145)

## 2015-03-11 LAB — CULTURE, BLOOD (ROUTINE X 2)
CULTURE: NO GROWTH
Culture: NO GROWTH

## 2015-03-11 LAB — CBC
HEMATOCRIT: 26.9 % — AB (ref 39.0–52.0)
Hemoglobin: 8.7 g/dL — ABNORMAL LOW (ref 13.0–17.0)
MCH: 29.4 pg (ref 26.0–34.0)
MCHC: 32.3 g/dL (ref 30.0–36.0)
MCV: 90.9 fL (ref 78.0–100.0)
PLATELETS: 517 10*3/uL — AB (ref 150–400)
RBC: 2.96 MIL/uL — AB (ref 4.22–5.81)
RDW: 13.4 % (ref 11.5–15.5)
WBC: 14.6 10*3/uL — ABNORMAL HIGH (ref 4.0–10.5)

## 2015-03-11 MED ORDER — PIPERACILLIN-TAZOBACTAM 3.375 G IVPB
3.3750 g | Freq: Three times a day (TID) | INTRAVENOUS | Status: DC
  Administered 2015-03-11 – 2015-03-17 (×17): 3.375 g via INTRAVENOUS
  Filled 2015-03-11 (×19): qty 50

## 2015-03-11 MED ORDER — AMOXICILLIN-POT CLAVULANATE 875-125 MG PO TABS
1.0000 | ORAL_TABLET | Freq: Two times a day (BID) | ORAL | Status: DC
Start: 2015-03-11 — End: 2015-03-11

## 2015-03-11 NOTE — Progress Notes (Signed)
[facility] Progress Note   [name]  WUJ:[id number]  DOB: August 06, 1940  DOA: 03/06/2015 PCP: Candi Leash, MD  Brief narrative: [name] is a 74 y.o. male is legally blind presents to the hospital for left foot pain. He was here couple of days ago and was given prescriptions for Keflex and Bactrim but did not have transportation to the pharmacy to get them filled. He returns due to increasing pain in his foot. He has no complaints of fevers chills, shortness or breath, cough, sinus drainage, vomiting or diarrhea.  Found to have infected wound on his left heel with leukocytosis and has acute renal failure   Subjective: Patient is awake and alert, continues to decline amputation.   Assessment/Plan: Principal Problem:  Sepsis- evidenced by tachycardia, leukocytosis- gangrene left foot - Necrotic ulcer on left foot is likely the source of infection (in setting of PAD)  - X-ray of the foot is negative for osteomyelitis - continue Vancomycin and Zosyn -  blood cultures negative - vascular surgery recommending AKA or BKA -Palliative consulted to discuss goals of care with patient and family members.   Active Problems: PAD - right ABI suggestive of critical insufficiency- left is borderline Critical  Urine culture >=100,000 COLONIES/mL AEROCOCCUS URINAE  - d/w ID, likely contaminant    ARF (acute renal failure)- CKD 2-3 - Cr remains stable at 1.22  Hypertension -BP elevated- home med list mentions HCTZ and lisinopril-patient states that he does not take any medication and does not have PCP - Norvasc started - will avoid tight control of blood pressure as he may become septic  Murmur - left  sternal border radiating to carotids -ECHO- reveals normal EF, grade 1 diastolic dysfunction, mild LVH   No PCP- have asked for case management assistance to help find PCP   Code Status:     Code Status Orders        Start     Ordered   03/06/15 1706  Full code    Continuous     03/06/15 1707     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, research (physical sciences) in chart  Disposition Plan: to be determined- family would like to take him home with them but he may need SNF DVT prophylaxis: lovenox Consultants:vasc sx Procedures: Appt with PCP: no PCP yet Antibiotics: Anti-infectives    Start     Dose/Rate Route Frequency Ordered Stop   03/11/15 1600  piperacillin-tazobactam (ZOSYN) IVPB 3.375 g     3.375 g 12.5 mL/hr over 240 Minutes Intravenous Every 8 hours 03/11/15 1102     03/11/15 1100  amoxicillin-clavulanate (AUGMENTIN) 875-125 MG per tablet 1 tablet  Status:  Discontinued     1 tablet Oral 2 times daily 03/11/15 0938 03/11/15 1101   03/10/15 1600  vancomycin (VANCOCIN) IVPB 1000 mg/200 mL premix     1,000 mg 200 mL/hr over 60 Minutes Intravenous Every 24 hours 03/09/15 1806     03/07/15 1800  vancomycin (VANCOCIN) IVPB 750 mg/150 ml premix  Status:  Discontinued     750 mg 150 mL/hr over 60 Minutes Intravenous Every 24 hours 03/06/15 1821 03/09/15 1806   03/07/15 0000  piperacillin-tazobactam (ZOSYN) IVPB 3.375 g  Status:  Discontinued     3.375 g 12.5 mL/hr over 240 Minutes Intravenous Every 8 hours 03/06/15 1729 03/11/15 0938   03/06/15 1730  vancomycin (VANCOCIN) IVPB 1000 mg/200 mL premix     1,000 mg 200 mL/hr over 60 Minutes Intravenous  Once 03/06/15 1703 03/06/15 1929   03/06/15  1730  piperacillin-tazobactam (ZOSYN) IVPB 3.375 g     3.375 g 100 mL/hr over 30 Minutes Intravenous  Once 03/06/15 1729 03/06/15 1858   03/06/15 1630  clindamycin (CLEOCIN) IVPB 600 mg  Status:  Discontinued     600 mg 100 mL/hr over 30 Minutes Intravenous 3 times per day 03/06/15 1620 03/06/15 1635      Objective: Filed Weights   03/06/15 1716  Weight: 55.475 kg (122 lb 4.8 oz)    Intake/Output Summary (Last 24 hours) at 03/11/15 1725 Last data filed at 03/11/15 1705  Gross per 24 hour  Intake 3592.5 ml  Output    875 ml  Net 2717.5 ml     Vitals Filed  Vitals:   03/10/15 1235 03/10/15 2057 03/11/15 0539 03/11/15 1320  BP: 150/67 137/75 139/76 135/77  Pulse: 88 93 87 95  Temp: 98.1 F (36.7 C) 98.3 F (36.8 C) 98.4 F (36.9 C) 98.1 F (36.7 C)  TempSrc: Oral Oral Oral   Resp: 18 16 16 16   Height:      Weight:      SpO2: 100% 99% 100% 99%    Exam:  General:  Pt is alert, not in acute distress  HEENT: No icterus, No thrush, oral mucosa moist- left eye  enucleated-right eye opaque: No scleral icterus  Cardiovascular: regular rate and rhythm, S1/S2 No murmur  Respiratory: clear to auscultation bilaterally   Abdomen: Soft, +Bowel sounds, non tender, non distended, no guarding  MSK: No LE edema, cyanosis or clubbing - large area of necrosis on left heel plantar aspect- no discharge, +surrounding tenderness - necrosis increased in size since admission-  foul smell  Data Reviewed: Basic Metabolic Panel:  Recent Labs Lab 03/07/15 0451 03/08/15 0456 03/09/15 0410 03/10/15 0447 03/11/15 0405  NA 131* 134* 135 133* 134*  K 4.0 4.3 4.5 4.4 4.1  CL 98* 104 99* 100* 100*  CO2 23 23 27 25 25   GLUCOSE 95 99 101* 107* 100*  BUN 24* 24* 26* 22* 20  CREATININE 1.37* 1.19 1.27* 1.26* 1.22  CALCIUM 9.8 9.4 10.1 9.9 9.7   Liver Function Tests:  Recent Labs Lab 03/07/15 0451  AST 29  ALT 26  ALKPHOS 71  BILITOT 0.8  PROT 6.7  ALBUMIN 2.9*   No results for input(s): LIPASE, AMYLASE in the last 168 hours. No results for input(s): AMMONIA in the last 168 hours. CBC:  Recent Labs Lab 03/06/15 1352 03/07/15 0451 03/08/15 0456 03/10/15 0447 03/11/15 0405  WBC 19.7* 14.7* 11.5* 13.2* 14.6*  NEUTROABS 18.0*  --   --   --   --   HGB 9.3* 8.9* 8.2* 8.7* 8.7*  HCT 28.0* 26.7* 25.1* 26.6* 26.9*  MCV 91.2 91.1 90.9 91.1 90.9  PLT 440* 466* 412* 454* 517*   Cardiac Enzymes: No results for input(s): CKTOTAL, CKMB, CKMBINDEX, TROPONINI in the last 168 hours. BNP (last 3 results) No results for input(s): BNP in the last  8760 hours.  ProBNP (last 3 results) No results for input(s): PROBNP in the last 8760 hours.  CBG: No results for input(s): GLUCAP in the last 168 hours.  Recent Results (from the past 240 hour(s))  Urine culture     Status: None   Collection Time: 03/06/15  3:31 PM  Result Value Ref Range Status   Specimen Description Urine  Final   Special Requests NONE  Final   Culture   Final    >=100,000 COLONIES/mL AEROCOCCUS URINAE Standardized susceptibility testing for this  organism is not available. Performed at [facility]    Report Status 03/08/2015 FINAL  Final  Culture, blood (routine x 2)     Status: None (Preliminary result)   Collection Time: 03/06/15  6:45 PM  Result Value Ref Range Status   Specimen Description BLOOD LEFT ARM  Final   Special Requests BOTTLES DRAWN AEROBIC ONLY 9CC  Final   Culture   Final    NO GROWTH 4 DAYS Performed at [facility]    Report Status PENDING  Incomplete  Culture, blood (routine x 2)     Status: None (Preliminary result)   Collection Time: 03/06/15  6:50 PM  Result Value Ref Range Status   Specimen Description BLOOD LEFT HAND  Final   Special Requests BOTTLES DRAWN AEROBIC ONLY 10CC  Final   Culture   Final    NO GROWTH 4 DAYS Performed at [facility]    Report Status PENDING  Incomplete  C difficile quick scan w PCR reflex     Status: None   Collection Time: 03/10/15 11:02 AM  Result Value Ref Range Status   C Diff antigen NEGATIVE NEGATIVE Final   C Diff toxin NEGATIVE NEGATIVE Final   C Diff interpretation Negative for toxigenic C. difficile  Final     Studies: No results found.  Scheduled Meds:  Scheduled Meds: . amLODipine  5 mg Oral Daily  . docusate sodium  100 mg Oral BID  . enoxaparin (LOVENOX) injection  40 mg Subcutaneous Q24H  . feeding supplement (ENSURE ENLIVE)  237 mL Oral TID BM  . piperacillin-tazobactam (ZOSYN)  IV  3.375 g Intravenous Q8H  . vancomycin  1,000 mg Intravenous Q24H    Continuous Infusions: . sodium chloride 75 mL/hr at 03/11/15 1646    Time spent on care of this patient: 25 min   Jeralyn Bennett, MD 03/11/2015, 5:25 PM  LOS: 5 days   [facility] Office  [phone] Pager - Text Page per [email or web address] If 7PM-7AM, please contact night-coverage [email or web address]

## 2015-03-12 ENCOUNTER — Encounter (HOSPITAL_COMMUNITY): Payer: Self-pay | Admitting: Student

## 2015-03-12 ENCOUNTER — Inpatient Hospital Stay (HOSPITAL_COMMUNITY): Payer: Medicare Other

## 2015-03-12 DIAGNOSIS — L8990 Pressure ulcer of unspecified site, unspecified stage: Secondary | ICD-10-CM

## 2015-03-12 MED ORDER — LORAZEPAM 1 MG PO TABS: 1.0000 mg | ORAL_TABLET | ORAL | Status: DC | PRN

## 2015-03-12 MED ORDER — OXYCODONE-ACETAMINOPHEN 5-325 MG PO TABS
1.0000 | ORAL_TABLET | Freq: Three times a day (TID) | ORAL | Status: DC
  Administered 2015-03-12 – 2015-03-18 (×18): 1 via ORAL
  Filled 2015-03-12 (×18): qty 1

## 2015-03-12 NOTE — Progress Notes (Addendum)
  PHYSICAL THERAPY TREATMENT NOTE   03/11/15 1115  PT Time Calculation  PT Start Time (ACUTE ONLY) 1015  PT Stop Time (ACUTE ONLY) 1034  PT Time Calculation (min) (ACUTE ONLY) 19 min  Subjective Data  Patient Stated Goal less pain  Pain Assessment  Faces Pain Scale 8  Cognition  Arousal/Alertness Awake/alert  Behavior During Therapy Restless;Agitated  Overall Cognitive Status No family/caregiver present to determine baseline cognitive functioning  Memory Decreased recall of precautions  Bed Mobility  Overal bed mobility Needs Assistance  Bed Mobility Supine to Sit;Sit to Supine  Supine to sit HOB elevated;Min assist  Sit to supine HOB elevated;Supervision  General bed mobility comments Assist to elevate trunk into sitting and increased time.  Max encouragement to continue sitting at EOB.   Transfers  General transfer comment Once at EOB, provided pt with RW for support, however pt refused to stand despite multiple attempts and max encouragement and education.    Ambulation/Gait  General Gait Details NT-pt unwilling/unable due to pain at this time  Balance  Sitting balance-Leahy Scale Fair  PT - End of Session  Equipment Utilized During Treatment Gait belt  Activity Tolerance Patient limited by pain  Patient left in bed;with call bell/phone within reach;with bed alarm set  PT - Assessment/Plan  PT Plan Discharge plan needs to be updated  PT Frequency (ACUTE ONLY) Min 3X/week  Follow Up Recommendations SNF;Supervision/Assistance - 24 hour  PT equipment Rolling walker with 5" wheels  PT Goal Progression  Progress towards PT goals Not progressing toward goals - comment  Acute Rehab PT Goals  PT Goal Formulation With patient  Time For Goal Achievement 03/22/15  Potential to Achieve Goals Fair  PT General Charges  $$ ACUTE PT VISIT 1 Procedure  PT Treatments  $Therapeutic Activity 8-22 mins   Tx completed by Harriet Butte PT  Note entered into chart by Rebeca Alert MPT   03/12/15

## 2015-03-12 NOTE — Progress Notes (Deleted)
   03/12/15 1200  Clinical Encounter Type  Visited With Patient and family together  Visit Type Initial;Psychological support;Spiritual support  Referral From Nurse  Consult/Referral To Chaplain  Spiritual Encounters  Spiritual Needs Emotional;Other (Comment) (Pastoral conversation)  Stress Factors  Patient Stress Factors Health changes (Palliative/pain management)  Family Stress Factors Health changes;Major life changes   Chaplain visited with patient after ref. from the nurse. The patient was laying on his side in the bed and showing signs of being in pain. His wife was sitting on the window seat near him. Both the patient and his wife were receptive to a visit from the [place].  The patient informed the Chaplain that he would be having a conversation with a doctor this afternoon about Hospice in place and pain management. He understood his condition, the severity of it and is at peace with the fact that his condition is worsening.  His wife stated that she was doing "ok." They both told the Chaplain that their faith in God is what is getting them through, even though it is difficult.  The patient's wife told the Chaplain that the patient is a Aldona Lento in the [employer].  They were receptive to a follow up visit from the [place]. Chaplain interventions included pastoral conversation, psychological /emotional support, family support and spiritual assessment.  Chaplain will follow up with the patient and his wife.

## 2015-03-12 NOTE — Consult Note (Signed)
Consultation Note Date: 03/12/2015   Patient Name: [name]  DOB: [birth year]  MRN: [id number]  Age / Sex: 74 y.o., male   PCP: Candi Leash, MD Referring Physician: Jeralyn Bennett, MD  Reason for Consultation: Establishing goals of care, Pain control and Psychosocial/spiritual support  Palliative Care Assessment and Plan Summary of Established Goals of Care and Medical Treatment Preferences   Clinical Assessment/Narrative: 74 yo with gangrene of his right heel. Was living at home with his significant other [name] prior to admission. He has a history of PVD and alcoholism. He does not see a primary care provider and has several ED visits in the last year.   His capacity is questionable- he was able to answer my questions about his name, DOB, and that he is in the hospital- but he has little or no understanding of his condition- I asked him about amputaion and he refused- I asked him what the resulty of that would be and he said he didn't know- I explained infection, pain and the wound that would evolve- and he just shook his head. He would not respond to priompting about how he felt about this- I also found him to be showing signs of severe pain - non-verbally- deep grimace- dfifficulty speaking and focusing -easily frustrated.?ongoing ETOGH abuse. He is a gentleman who clearly does not choose medical care in general so it is even more important that we clarify his code status and wishes for surgical intervention.   This will only get worse- he will most certainly not tolerate weight bearing or PT. Need to clarify if we are looking at hospice EOL care or amputation.   Contacts/Participants in Discussion: No HCPOA, Patient only, call placed to "[name]" per patient request- this is his niece.  Code Status/Advance Care Planning:  Full Code, patient elected for DNR today but I need to get consent from family before placing the order- he would certainly not want a medicalized life based  on our conversation today.  Symptom Management:   Poor control of pain currently: schedule oxycodone TID, continue PRN breakthrough medication  Start low dose lyrica for nerve pain with limb  Palliative Prophylaxis: bowel regimen  Cancel PT until pain controlled  Additional Recommendations (Limitations, Scope, Preferences):  To be determined Psycho-social/Spiritual:   Support System: poor   Desire for further Chaplaincy support:no  Prognosis: < 6 months  Discharge Planning:  Unclear at this point- he will need 24/7 care       Chief Complaint/History of Present Illness: leg pain  Primary Diagnoses  Present on Admission:  . (Resolved) Cellulitis  Palliative Review of Systems:  I have reviewed the medical record, interviewed the patient and family, and examined the patient. The following aspects are pertinent.  Past Medical History  Diagnosis Date  . Renal insufficiency     EMS reports "per familiy member "kidney problem"  . Legally blind     per ems  . Chronic alcohol abuse   . H/O enucleation of left eyeball   . Diabetes mellitus without complication    Social History   Social History  . Marital Status: Widowed    Spouse Name: N/A  . Number of Children: N/A  . Years of Education: N/A   Social History Main Topics  . Smoking status: Current Every Day Smoker    Types: Cigarettes  . Smokeless tobacco: None  . Alcohol Use: Yes  . Drug Use: None  . Sexual Activity: Not Asked   Other Topics Concern  .  None   Social History Narrative   Family History  Problem Relation Age of Onset  . Family history unknown: Yes   Scheduled Meds: . amLODipine  5 mg Oral Daily  . docusate sodium  100 mg Oral BID  . enoxaparin (LOVENOX) injection  40 mg Subcutaneous Q24H  . feeding supplement (ENSURE ENLIVE)  237 mL Oral TID BM  . oxyCODONE-acetaminophen  1 tablet Oral TID  . piperacillin-tazobactam (ZOSYN)  IV  3.375 g Intravenous Q8H  . vancomycin  1,000 mg  Intravenous Q24H   Continuous Infusions: . sodium chloride 75 mL/hr at 03/11/15 1646   PRN Meds:.acetaminophen **OR** acetaminophen, alum & mag hydroxide-simeth, LORazepam, ondansetron **OR** ondansetron (ZOFRAN) IV, oxyCODONE Medications Prior to Admission:  Prior to Admission medications   Not on File   No Known Allergies CBC:    Component Value Date/Time   WBC 14.6* 03/11/2015 0405   HGB 8.7* 03/11/2015 0405   HCT 26.9* 03/11/2015 0405   PLT 517* 03/11/2015 0405   MCV 90.9 03/11/2015 0405   NEUTROABS 18.0* 03/06/2015 1352   LYMPHSABS 0.7 03/06/2015 1352   MONOABS 0.8 03/06/2015 1352   EOSABS 0.0 03/06/2015 1352   BASOSABS 0.0 03/06/2015 1352   Comprehensive Metabolic Panel:    Component Value Date/Time   NA 134* 03/11/2015 0405   K 4.1 03/11/2015 0405   CL 100* 03/11/2015 0405   CO2 25 03/11/2015 0405   BUN 20 03/11/2015 0405   CREATININE 1.22 03/11/2015 0405   GLUCOSE 100* 03/11/2015 0405   CALCIUM 9.7 03/11/2015 0405   CALCIUM 10.1 01/19/2011 2115   AST 29 03/07/2015 0451   ALT 26 03/07/2015 0451   ALKPHOS 71 03/07/2015 0451   BILITOT 0.8 03/07/2015 0451   PROT 6.7 03/07/2015 0451   ALBUMIN 2.9* 03/07/2015 0451    Physical Exam: Vital Signs: BP 158/74 mmHg  Pulse 79  Temp(Src) 98 F (36.7 C) (Oral)  Resp 17  Ht 5\' 4"  (1.626 m)  Wt 55.475 kg (122 lb 4.8 oz)  BMI 20.98 kg/m2  SpO2 100% SpO2: SpO2: 100 % O2 Device: O2 Device: Not Delivered O2 Flow Rate:   Intake/output summary:  Intake/Output Summary (Last 24 hours) at 03/12/15 1043 Last data filed at 03/12/15 0948  Gross per 24 hour  Intake    480 ml  Output   1200 ml  Net   -720 ml   LBM: Last BM Date: 03/10/15 Baseline Weight: Weight: 55.475 kg (122 lb 4.8 oz) Most recent weight: Weight: 55.475 kg (122 lb 4.8 oz)  Exam Findings:           Palliative Performance Scale: 40              Additional Data Reviewed: Recent Labs     03/10/15  0447  03/11/15  0405  WBC  13.2*  14.6*  HGB   8.7*  8.7*  PLT  454*  517*  NA  133*  134*  BUN  22*  20  CREATININE  1.26*  1.22     Time In: 8AM  Time Out: 8:30AM   Time Total: 30 min Greater than 50%  of this time was spent counseling and coordinating care related to the above assessment and plan.  Signed by: Hilbert Odor, DO  03/12/2015, 10:43 AM  Please contact Palliative Medicine Team phone at [phone] for questions and concerns.

## 2015-03-12 NOTE — Progress Notes (Signed)
ANTIBIOTIC CONSULT NOTE - FOLLOW UP  Pharmacy Consult for Vancomycin, Zosyn Indication: rule out sepsis, left foot gangrene  No Known Allergies  Patient Measurements: Height:  (162.6 cm) Weight: 122 lb 4.8 oz (55.475 kg) IBW/kg (Calculated) : 59.2  Vital Signs: Temp: 98 F (36.7 C) (09/15 0830) Temp Source: Oral (09/15 0830) BP: 158/74 mmHg (09/15 0830) Pulse Rate: 79 (09/15 0830) Intake/Output from previous day: 09/14 0701 - 09/15 0700 In: 720 [P.O.:720] Out: 875 [Urine:875]  Labs:  Recent Labs  03/10/15 0447 03/11/15 0405  WBC 13.2* 14.6*  HGB 8.7* 8.7*  PLT 454* 517*  CREATININE 1.26* 1.22   Estimated Creatinine Clearance: 43 mL/min (by C-G formula based on Cr of 1.22).  Recent Labs  03/09/15 1706  VANCOTROUGH 11      Assessment: 74 y.o. 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 He was given prescriptions for Keflex and Bactrim in ED several days ago but was unable to get prescriptions filled. He now meets sepsis criteria with tachycardia and leukocytosis with necrotic foot ulcer. Foot X-ray negative for osteo, but Vascular surgery recommends BKA vs AKA, pending patient consent - Patient is refusing amputation. Pharmacy is consulted to dose vancomycin/Zosyn for sepsis.  Anti-infectives: 9/9 Clindamycin x 1 9/9 >> Zosyn >> 9/9 >> Vancomycin >>   Micro: 9/9 Blood cxt x2: ngtd 9/9 Urine cxt: >100k Aerococcus Urinae 9/13 C. Diff: negative  Today, 03/12/2015:  Afebrile  WBC elevated, worsening  SCr stable  Dose changes/levels: 9/12 VT 1700 = 36mcg/ml on  q24h  (before 4th dose)   Goal of Therapy:  Vancomycin trough level 15-20 mcg/ml Appropriate abx dosing, eradication of infection.   Plan:  Day #7 antibiotics  Continue Zosyn 3.375g IV Q8H infused over 4hrs.  Continue Vancomycin 1gm IV q24h.  NO need to repeat level at this time, recheck Monday if remains on vancomycin  Follow up renal fxn, culture  results, and clinical course.  Juliette Alcide, PharmD, BCPS.   Pager: [phone] 03/12/2015 10:49 AM

## 2015-03-12 NOTE — Progress Notes (Signed)
PT Cancellation Note  Patient Details Name: [name] MRN: [id number] DOB: 1940/08/24   Cancelled Treatment:    Reason Eval/Treat Not Completed: Patient declined, no reason specified. Please note last d/c recommendation on 6/14 was for SNF. Pt is not progressing well with mobility and unsure if family is able to provide current level of care. Recommend CSW consult. Thanks.   Rebeca Alert, MPT Pager: [phone]

## 2015-03-12 NOTE — Progress Notes (Signed)
CSW spoke with pt niece, Boyce Medici who states that she would be available for a meeting tomorrow morning. CSW confirmed with Dr. Phillips Odor and pt niece of 10 am meeting time.   Olga Coaster, LCSW  Clinical Social Work  [employer] [phone]

## 2015-03-12 NOTE — Progress Notes (Signed)
[facility] PROGRESS NOTE  Pistol Kessenich Auerbach WUJ:[id number] DOB: [birth year] DOA: 03/06/2015 PCP: Candi Leash, MD   HPI/Subjective: [name] is a 74 y.o black male who presented for admission on 03/06/15 for sepsis secondary to L heel wound. PMH includes renal insufficiency and legal blindness. Patient has been previously seen in the ED for treatment, but was unable to fill prescriptions due to transportation issues. Upon admission, his heel pain is much worse. He denies fever, chills, N/V/D, or peripheral edema. Vascular consultation recommended amputation (BKA vs. AKA) based on severe peripheral vascular disease and gangrenous wound, but patient does not have functional capacity to make this decisions. Next of kin has been contacted and we are awaiting decision for surgery.   Today the patient appears to be doing well. He is sitting up in bed eating with assistance. He denies fever, chills, N/V/D. Denies urinary complaints. He reports that the pain in his L heel has improved, but is still noticeable. He has not had contact with his family regarding decisions for amputation.   Assessment/Plan: 1. Sepsis: Secondary to L heel wound. Currently afebrile. No tachycardia.  Most recent CBC shows continued leukocytosis that is increasing (14.6 today from 13.2 yesterday) despite being on antimicrobial therapy. No evidence of secondary infection occuring externally, but d/t coarse rhonchi on R lung exam we obtained a CXR to r/o HA Pneumonia. CXR shows no acute cardiopulmonary disease or infiltrate. Plan is to continue broad spectrum antibiotics and monitor.  2. Necrotic foot ulcer: L heel ulceration secondary to PAD. Vascular recommends amputation. Palliative care consulted and scheduled meeting for tomorrow to discuss options with patient and family.  3. PAD: Continued disease (ABI shows R 24, L .34.). Plan is to continue to monitor for signs of additional ischemia.  4. ARF: BUN/SCr stabilized (20/1.22).  Suspected etiology is prerenal d/t improvement w/ fluid therapy. Plan is to continue with fluid therapy  5. Anemia: Appears to be stable and slightly increasing (Hgb: 8.7). Plan is to continue to monitor.  6. HTN: Currently stable at 130/71. Plan is to continue Norvasc QD and monitor appropriately.  7. Murmur: L/R sternal boarder murmur. Echo showed EF of 55-60% with mild LVH and no wall motion abnormalities. Plan is to continue to monitor for complications.   Code Status: Full 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: None at bedside  Disposition Plan: Pending amputation decisions- Possible SNF.    Consultants:  Palliative care  Vascular   Procedures:  Echocardiogram  Antibiotics:  See below   Anti-infectives    Start     Dose/Rate Route Frequency Ordered Stop   03/11/15 1600  piperacillin-tazobactam (ZOSYN) IVPB 3.375 g     3.375 g 12.5 mL/hr over 240 Minutes Intravenous Every 8 hours 03/11/15 1102     03/11/15 1100  amoxicillin-clavulanate (AUGMENTIN) 875-125 MG per tablet 1 tablet  Status:  Discontinued     1 tablet Oral 2 times daily 03/11/15 0938 03/11/15 1101   03/10/15 1600  vancomycin (VANCOCIN) IVPB 1000 mg/200 mL premix     1,000 mg 200 mL/hr over 60 Minutes Intravenous Every 24 hours 03/09/15 1806     03/07/15 1800  vancomycin (VANCOCIN) IVPB 750 mg/150 ml premix  Status:  Discontinued     750 mg 150 mL/hr over 60 Minutes Intravenous Every 24 hours 03/06/15 1821 03/09/15 1806   03/07/15 0000  piperacillin-tazobactam (ZOSYN) IVPB 3.375 g  Status:  Discontinued     3.375 g 12.5 mL/hr over 240 Minutes Intravenous Every 8 hours 03/06/15 1729 03/11/15 9562  03/06/15 1730  vancomycin (VANCOCIN) IVPB 1000 mg/200 mL premix     1,000 mg 200 mL/hr over 60 Minutes Intravenous  Once 03/06/15 1703 03/06/15 1929   03/06/15 1730  piperacillin-tazobactam (ZOSYN) IVPB 3.375 g     3.375 g 100 mL/hr over 30 Minutes Intravenous  Once 03/06/15 1729 03/06/15 1858   03/06/15 1630  clindamycin  (CLEOCIN) IVPB 600 mg  Status:  Discontinued     600 mg 100 mL/hr over 30 Minutes Intravenous 3 times per day 03/06/15 1620 03/06/15 1635      Objective: Filed Vitals:   03/12/15 1320  BP: 130/71  Pulse: 83  Temp: 97.9 F (36.6 C)  Resp: 18    Intake/Output Summary (Last 24 hours) at 03/12/15 1358 Last data filed at 03/12/15 1223  Gross per 24 hour  Intake    600 ml  Output   1050 ml  Net   -450 ml   Filed Weights   03/06/15 1716  Weight: 55.475 kg (122 lb 4.8 oz)    Exam:   General:  Frail-appearing elderly man, sitting in bed, no acute distress  HEENT: Patient is legally blind. No redness or erythema in eyes.   Cardiovascular: RRR, no r/g.+Diastolic murmur at LLSB. No peripheral edema noted. Diminished pedal pulses b/l.  Respiratory: Coarse rhonchi noted on R side. No wheeze.  Abdomen: Soft, non-tender, non distended.   Neuro: A&Ox3. Appropriate movement of UE/LE. No focal neurological deficits.   Musculoskeletal: Age-appropriate muscular strength/movement in UE/LE.   LLE: L. Heel necrotic with tissue debris. Painful to touch. No evidence of abscess or drainage.   Data Reviewed: Basic Metabolic Panel:  Recent Labs Lab 03/07/15 0451 03/08/15 0456 03/09/15 0410 03/10/15 0447 03/11/15 0405  NA 131* 134* 135 133* 134*  K 4.0 4.3 4.5 4.4 4.1  CL 98* 104 99* 100* 100*  CO2 GLUCOSE 95 99 101* 107* 100*  BUN 24* 24* 26* 22* 20  CREATININE 1.37* 1.19 1.27* 1.26* 1.22  CALCIUM 9.8 9.4 10.1 9.9 9.7   Liver Function Tests:  Recent Labs Lab 03/07/15 0451  AST 29  ALT 26  ALKPHOS 71  BILITOT 0.8  PROT 6.7  ALBUMIN 2.9*   No results for input(s): LIPASE, AMYLASE in the last 168 hours. No results for input(s): AMMONIA in the last 168 hours. CBC:  Recent Labs Lab 03/06/15 1352 03/07/15 0451 03/08/15 0456 03/10/15 0447 03/11/15 0405  WBC 19.7* 14.7* 11.5* 13.2* 14.6*  NEUTROABS 18.0*  --   --   --   --   HGB 9.3* 8.9* 8.2*  8.7* 8.7*  HCT 28.0* 26.7* 25.1* 26.6* 26.9*  MCV 91.2 91.1 90.9 91.1 90.9  PLT 440* 466* 412* 454* 517*   Cardiac Enzymes: No results for input(s): CKTOTAL, CKMB, CKMBINDEX, TROPONINI in the last 168 hours. BNP (last 3 results) No results for input(s): BNP in the last 8760 hours.  ProBNP (last 3 results) No results for input(s): PROBNP in the last 8760 hours.  CBG: No results for input(s): GLUCAP in the last 168 hours.  Recent Results (from the past 240 hour(s))  Urine culture     Status: None   Collection Time: 03/06/15  3:31 PM  Result Value Ref Range Status   Specimen Description Urine  Final   Special Requests NONE  Final   Culture   Final    >=100,000 COLONIES/mL AEROCOCCUS URINAE Standardized susceptibility testing for this organism is not available. Performed at [facility]  Report Status 03/08/2015 FINAL  Final  Culture, blood (routine x 2)     Status: None   Collection Time: 03/06/15  6:45 PM  Result Value Ref Range Status   Specimen Description BLOOD LEFT ARM  Final   Special Requests BOTTLES DRAWN AEROBIC ONLY 9CC  Final   Culture   Final    NO GROWTH 5 DAYS Performed at [facility]    Report Status 03/11/2015 FINAL  Final  Culture, blood (routine x 2)     Status: None   Collection Time: 03/06/15  6:50 PM  Result Value Ref Range Status   Specimen Description BLOOD LEFT HAND  Final   Special Requests BOTTLES DRAWN AEROBIC ONLY 10CC  Final   Culture   Final    NO GROWTH 5 DAYS Performed at [facility]    Report Status 03/11/2015 FINAL  Final  C difficile quick scan w PCR reflex     Status: None   Collection Time: 03/10/15 11:02 AM  Result Value Ref Range Status   C Diff antigen NEGATIVE NEGATIVE Final   C Diff toxin NEGATIVE NEGATIVE Final   C Diff interpretation Negative for toxigenic C. difficile  Final     Studies: Dg Chest Port 1 View  03/12/2015   CLINICAL DATA:  Shortness of breath R06.02 (ICD-10-CM)  Followup  shortness of breath. History of infected foot wound on the left.  EXAM: PORTABLE CHEST - 1 VIEW  COMPARISON:  01/20/2011  FINDINGS: Cardiac silhouette is normal in size and configuration. No mediastinal or hilar masses or evidence of adenopathy there are prominent bronchovascular markings, stable. No lung consolidation or edema. No pleural effusion or pneumothorax. Lungs are hyperexpanded.  There are old healed rib fractures on the right.  IMPRESSION: No acute cardiopulmonary disease.   Electronically Signed   By: Amie Portland M.D.   On: 03/12/2015 12:03    Scheduled Meds: . amLODipine  5 mg Oral Daily  . docusate sodium  100 mg Oral BID  . enoxaparin (LOVENOX) injection  40 mg Subcutaneous Q24H  . feeding supplement (ENSURE ENLIVE)  237 mL Oral TID BM  . oxyCODONE-acetaminophen  1 tablet Oral TID  . piperacillin-tazobactam (ZOSYN)  IV  3.375 g Intravenous Q8H  . vancomycin  1,000 mg Intravenous Q24H   Continuous Infusions: . sodium chloride 75 mL/hr at 03/11/15 1646    Principal Problem:   Sepsis Active Problems:   ARF (acute renal failure)   Gangrene   Benign essential HTN   Blind in both eyes    Time spent: 30 minutes     Micayla Zeltman, Student-PA  [facility] If 7PM-7AM, please contact night-coverage at [email or web address], password [facility] 03/12/2015, 1:58 PM  LOS: 6 days    Addendum I personally evaluated patient on 03/12/2015 and agree with the above findings. Mr. Waage is a pleasant 74 year old gentleman admitted for sepsis secondary to necrotic left heel. During this hospitalization has refused to undergo amputation, intervention recommended by vascular surgery. There has been some question as to his ability to make decisions for himself. I discussed case with Dr.Goulding of palliative care. A family meeting has been arranged for tomorrow as I think it would be helpful to bring their perspective in our discussions. When I asked Mr. Cartmell about his thoughts on undergoing  amputation he told me to "talk to them" referring to family members. Will continue broad-spectrum IV antimicrobial therapy with Vancomycin and Zosyn.

## 2015-03-13 DIAGNOSIS — Z515 Encounter for palliative care: Secondary | ICD-10-CM

## 2015-03-13 LAB — CREATININE, SERUM
Creatinine, Ser: 1.08 mg/dL (ref 0.61–1.24)
GFR calc Af Amer: >60 mL/min (ref >60)
GFR calc non Af Amer: >60 mL/min (ref >60)

## 2015-03-13 NOTE — Progress Notes (Signed)
This letter is to conform that Tyson Dense was at a family meeting at [facility] to discuss medical decisions regarding her uncle's clinical condition.  Thank you for your understanding, Eduard Roux, ANP-ACHPN Palliative Medicine Team [phone]

## 2015-03-13 NOTE — Progress Notes (Signed)
[facility] PROGRESS NOTE  [name] FUI:[id number] DOB: [birth year] DOA: 03/06/2015 PCP: Candi Leash, MD  HPI/Subjective: [name] is a 74 y.o black male who presented for admission on 03/06/15 for sepsis secondary to L heel wound. PMH includes renal insufficiency, legal blindness, and alcohol abuse. Patient has been previously seen in the ED for treatment, but was unable to fill prescriptions due to transportation issues. Upon admission, his heel pain is much worse. He denies fever, chills, N/V/D, or peripheral edema. Vascular consultation recommended amputation (BKA vs. AKA) based on severe peripheral vascular disease and gangrenous wound, but patient does not have functional capacity to make this decisions. Next of kin has been contacted for surgical decision.   Today the patient appears to be doing well. He is asleep, but will wake up and respond appropriately to questions. He denies fever, chills, N/V/D. Denies urinary complaints. He reports that the pain in his L heel is still improving, but is still noticeable when touched. He reports that his family will be joining him today to discuss plans of care.   Assessment/Plan: 1. Sepsis: Secondary to L heel wound. Remains afebrile. No tachycardia. Have not received additional CBC today, but as of yesteray  CBC showed increasing leukocytosis despite being on antimicrobial therapy. He continues to show no evidence of secondary infection occuring externally. Plan is to continue broad spectrum antibiotics and monitor.  2. Necrotic foot ulcer: L heel ulceration secondary to PAD. Vascular recommends amputation. Palliative care consulted and met with patient/family today (see goals of care below).  3. PAD: Continued disease (ABI shows R .24, L .34.). Plan is to continue to monitor for signs of additional ischemia.  4. ARF: SCr stable today at 1.08. Suspected etiology is prerenal d/t improvement w/ fluid therapy. Plan is to continue with fluid  therapy  5. Anemia: Appears to be stable and slightly increasing (Hgb: 8.7). Plan is to continue to monitor.  6. HTN: BP increases slightly over the night (highest 163/79), but is currently closer to baseline at 150/87. Plan is to continue Norvasc QD and monitor appropriately. Consider increasing dose if BP continues to elevate.  7. Murmur: L/R sternal boarder murmur. Echo showed EF of 55-60% with mild LVH and no wall motion abnormalities. Plan is to continue to monitor for complications. Do not anticipate this being a complication to surgery (if that is decided) 8. Goals of care: Meeting with palliative care and multiple family members occurred today. Palliative care agrees that the patient does not currently have functional capacity to make the decision regarding amputation on his own. After extensive discussion, family is currently in favor with proceeding with amputation. Plan is to continue with broad spectrum antibiotics and wound care management. Will obtain psych consult to determine capacity level of patient. If patient is deemed unable to make decisions, we will contact vascular to proceed with amputation per next of kin desires.   Code Status: Full 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: Several family members (10+) at bed side Disposition Plan: SNF (regarless of amputation or not)   Consultants:  Palliative care   Vascular   Procedures:  Echocardiogram  Antibiotics:  See below  Anti-infectives    Start     Dose/Rate Route Frequency Ordered Stop   03/11/15 1600  piperacillin-tazobactam (ZOSYN) IVPB 3.375 g     3.375 g 12.5 mL/hr over 240 Minutes Intravenous Every 8 hours 03/11/15 1102     03/11/15 1100  amoxicillin-clavulanate (AUGMENTIN) 875-125 MG per tablet 1 tablet  Status:  Discontinued  1 tablet Oral 2 times daily 03/11/15 0938 03/11/15 1101   03/10/15 1600  vancomycin (VANCOCIN) IVPB 1000 mg/200 mL premix     1,000 mg 200 mL/hr over 60 Minutes Intravenous Every 24 hours 03/09/15  1806     03/07/15 1800  vancomycin (VANCOCIN) IVPB 750 mg/150 ml premix  Status:  Discontinued     750 mg 150 mL/hr over 60 Minutes Intravenous Every 24 hours 03/06/15 1821 03/09/15 1806   03/07/15 0000  piperacillin-tazobactam (ZOSYN) IVPB 3.375 g  Status:  Discontinued     3.375 g 12.5 mL/hr over 240 Minutes Intravenous Every 8 hours 03/06/15 1729 03/11/15 0938   03/06/15 1730  vancomycin (VANCOCIN) IVPB 1000 mg/200 mL premix     1,000 mg 200 mL/hr over 60 Minutes Intravenous  Once 03/06/15 1703 03/06/15 1929   03/06/15 1730  piperacillin-tazobactam (ZOSYN) IVPB 3.375 g     3.375 g 100 mL/hr over 30 Minutes Intravenous  Once 03/06/15 1729 03/06/15 1858   03/06/15 1630  clindamycin (CLEOCIN) IVPB 600 mg  Status:  Discontinued     600 mg 100 mL/hr over 30 Minutes Intravenous 3 times per day 03/06/15 1620 03/06/15 1635        Objective: Filed Vitals:   03/13/15 0952  BP: 163/79  Pulse: 88  Temp: 97.4 F (36.3 C)  Resp:     Intake/Output Summary (Last 24 hours) at 03/13/15 1402 Last data filed at 03/13/15 0900  Gross per 24 hour  Intake    980 ml  Output   2850 ml  Net  -1870 ml   Filed Weights   03/06/15 1716  Weight: 55.475 kg (122 lb 4.8 oz)    Exam: General: Frail-appearing elderly man, sleeping upon arrival, but wakes up when asked, no acute distress HEENT: Patient is legally blind. No redness or erythema in eyes.  Cardiovascular: RRR, no r/g.+Diastolic murmur at LLSB. No peripheral edema noted. Diminished pedal pulses b/l. Respiratory: CTA b/l, no wheeze or rhonchi.  Abdomen: Soft, non-tender, non distended.  Neuro: A&Ox3. Appropriate movement of UE/LE. No focal neurological deficits.  Musculoskeletal: Age-appropriate muscular strength/movement in UE/LE.  1. LE: L. Heel necrotic with tissue debris. Painful to touch. No evidence of abscess or drainage. No evidence of ulceration on R.   Data Reviewed: Basic Metabolic Panel:  Recent Labs Lab  03/07/15 0451 03/08/15 0456 03/09/15 0410 03/10/15 0447 03/11/15 0405 03/13/15 0432  NA 131* 134* 135 133* 134*  --   K 4.0 4.3 4.5 4.4 4.1  --   CL 98* 104 99* 100* 100*  --   CO2 $Re'23 23 27 25 25  'SDi$ --   GLUCOSE 95 99 101* 107* 100*  --   BUN 24* 24* 26* 22* 20  --   CREATININE 1.37* 1.19 1.27* 1.26* 1.22 1.08  CALCIUM 9.8 9.4 10.1 9.9 9.7  --    Liver Function Tests:  Recent Labs Lab 03/07/15 0451  AST 29  ALT 26  ALKPHOS 71  BILITOT 0.8  PROT 6.7  ALBUMIN 2.9*   No results for input(s): LIPASE, AMYLASE in the last 168 hours. No results for input(s): AMMONIA in the last 168 hours. CBC:  Recent Labs Lab 03/07/15 0451 03/08/15 0456 03/10/15 0447 03/11/15 0405  WBC 14.7* 11.5* 13.2* 14.6*  HGB 8.9* 8.2* 8.7* 8.7*  HCT 26.7* 25.1* 26.6* 26.9*  MCV 91.1 90.9 91.1 90.9  PLT 466* 412* 454* 517*   Cardiac Enzymes: No results for input(s): CKTOTAL, CKMB, CKMBINDEX, TROPONINI in the last  168 hours. BNP (last 3 results) No results for input(s): BNP in the last 8760 hours.  ProBNP (last 3 results) No results for input(s): PROBNP in the last 8760 hours.  CBG: No results for input(s): GLUCAP in the last 168 hours.  Recent Results (from the past 240 hour(s))  Urine culture     Status: None   Collection Time: 03/06/15  3:31 PM  Result Value Ref Range Status   Specimen Description Urine  Final   Special Requests NONE  Final   Culture   Final    >=100,000 COLONIES/mL AEROCOCCUS URINAE Standardized susceptibility testing for this organism is not available. Performed at [facility]    Report Status 03/08/2015 FINAL  Final  Culture, blood (routine x 2)     Status: None   Collection Time: 03/06/15  6:45 PM  Result Value Ref Range Status   Specimen Description BLOOD LEFT ARM  Final   Special Requests BOTTLES DRAWN AEROBIC ONLY [place]  Final   Culture   Final    NO GROWTH 5 DAYS Performed at [facility]    Report Status 03/11/2015 FINAL  Final   Culture, blood (routine x 2)     Status: None   Collection Time: 03/06/15  6:50 PM  Result Value Ref Range Status   Specimen Description BLOOD LEFT HAND  Final   Special Requests BOTTLES DRAWN AEROBIC ONLY 10CC  Final   Culture   Final    NO GROWTH 5 DAYS Performed at [facility]    Report Status 03/11/2015 FINAL  Final  C difficile quick scan w PCR reflex     Status: None   Collection Time: 03/10/15 11:02 AM  Result Value Ref Range Status   C Diff antigen NEGATIVE NEGATIVE Final   C Diff toxin NEGATIVE NEGATIVE Final   C Diff interpretation Negative for toxigenic C. difficile  Final     Studies: Dg Chest Port 1 View  03/12/2015   CLINICAL DATA:  Shortness of breath R06.02 (ICD-10-CM)  Followup shortness of breath. History of infected foot wound on the left.  EXAM: PORTABLE CHEST - 1 VIEW  COMPARISON:  01/20/2011  FINDINGS: Cardiac silhouette is normal in size and configuration. No mediastinal or hilar masses or evidence of adenopathy there are prominent bronchovascular markings, stable. No lung consolidation or edema. No pleural effusion or pneumothorax. Lungs are hyperexpanded.  There are old healed rib fractures on the right.  IMPRESSION: No acute cardiopulmonary disease.   Electronically Signed   By: Lajean Manes M.D.   On: 03/12/2015 12:03    Scheduled Meds: . amLODipine  5 mg Oral Daily  . docusate sodium  100 mg Oral BID  . enoxaparin (LOVENOX) injection  40 mg Subcutaneous Q24H  . feeding supplement (ENSURE ENLIVE)  237 mL Oral TID BM  . oxyCODONE-acetaminophen  1 tablet Oral TID  . piperacillin-tazobactam (ZOSYN)  IV  3.375 g Intravenous Q8H  . vancomycin  1,000 mg Intravenous Q24H   Continuous Infusions: . sodium chloride 75 mL/hr at 03/13/15 7517    Principal Problem:   Sepsis Active Problems:   ARF (acute renal failure)   Gangrene   Benign essential HTN   Blind in both eyes   Palliative care encounter    Time spent: 30 minutes    Micayla  Zeltman, Student-PA  [facility] If 7PM-7AM, please contact night-coverage at [email or web address], password [facility] 03/13/2015, 2:02 PM  LOS: 7 days     Addendum  I personally  evaluated patient on 03/13/2015 and agree with the above findings. [name] is a 74 year old gentleman with multiple comorbidities including diabetes mellitus, tobacco abuse, hypertension, peripheral arterial disease medication nonadherence. He presented with necrotic heel. During this hospitalization he was seen and evaluated by vascular surgery who has recommended pursuing amputation, unfortunately patient has declined this procedure. There has been concerns over having capacity to make his own medical decisions. Today we held on extensive meeting with multiple family members.  Family members reporting that in the past he has refused medical interventions and has not been compliant with his diabetic regimen. Already has significant disability from being legally blind. This morning he stated he would be willing to undergo the procedure however this may change in unsure if he would be willing to sign a consent. Given questions about his medical capacity we have consulted psychiatry to provide an opinion on this matter.  He remains on broad-spectrum empiric IV antibiotic therapy with vancomycin and Zosyn. He remains hemodynamically stable, nontoxic appearing, tolerating by mouth intake.

## 2015-03-13 NOTE — Progress Notes (Signed)
PT Cancellation Note  Patient Details Name: [name] MRN: [id number] DOB: 05-Aug-1940   Cancelled Treatment:    Reason Eval/Treat Not Completed: Other (comment) (Note family meeting held today  w/ Palliative care. For possible amputation.Will need PT at that time if it is done. Will follow along. )   Rada Hay 03/13/2015, 2:13 PM Blanchard Kelch PT [phone]

## 2015-03-13 NOTE — Care Management Note (Signed)
Case Management Note  Patient Details  Name: [name] MRN: [id number] Date of Birth: [birth year]  Subjective/Objective:          74 yo admitted with Sepsis/cellulitis          Action/Plan: From home with "friend"  Expected Discharge Date:                  Expected Discharge Plan:  Skilled Nursing Facility  In-House Referral:  Clinical Social Work  Discharge planning Services  CM Consult  Post Acute Care Choice:    Choice offered to:     DME Arranged:    DME Agency:     HH Arranged:    HH Agency:     Status of Service:  In process, will continue to follow  Medicare Important Message Given:  Yes-second notification given Date Medicare IM Given:    Medicare IM give by:    Date Additional Medicare IM Given:    Additional Medicare Important Message give by:     If discussed at [place] Length of Stay Meetings, dates discussed:    Additional Comments: PT is recommending SNF. Awaiting patient/family decision for amputation. CM will continue to follow. Bartholome Bill, RN 03/13/2015, 2:31 PM

## 2015-03-13 NOTE — Progress Notes (Signed)
Consultation Note Date: 03/13/2015   Patient Name: [name]  DOB: 06/01/1941  MRN: [id number]  Age / Sex: 74 y.o., male   PCP: Candi Leash, MD Referring Physician: Jeralyn Bennett, MD  Reason for Consultation: Establishing goals of care and Pain control  Palliative Care Assessment and Plan Summary of Established Goals of Care and Medical Treatment Preferences   Clinical Assessment/Narrative: Pt is a 74 yo man with DM, ETOH misuse, now with gangrene of his right heell. Pt has been seen by vascular surgery who has been talking to pt re: amputation to some degree. Pt has a ABI of 0.2 to RLE and LLE 0.37. Had a family meeting with multiple family members where risks and benefits of surgery were discussed as well as comfort based approach addressed. Pt himself, per family has been intolerant of medical care in the past, refusing to go to the doctor, refusing eye surgery to preserve his sight for example. Since being in the hospital he has been inconsistent in his answers regarding amputation. This morning he said "yes if it wold stop the pain". Reassured pt and his family that pain could be controlled without amputation and that amputation would not be a guaranteed success, nor pain-free. Pt at this point would have to live at a SNF whether he goes forward with amputation or not. Pt is unmarried with no children and has a brother and sister who are present today and decision makers by default. They are in favor with proceeding with amputation despite known risks. His sister recognizes that he is not a good candidate but has also had the experience of amputation giving one of her loved ones a longer prognosis, with the feeling that it "went well". Brother stated he wold want him to have it whether pt agreed or not. I did explain that if he can speak for himself that it is his decision. Also discussed that if pt was unable to sign consent they would have to do this, which sister was not willing to do. We  also discussed that if pt were to become clearer and develop capacity he may refuse again . Pt has PVD, DM and Gest-standing ETOH abuse, blind and would have a great deal of difficulty improving in terms of functional status even if surgery successful. Also discussed with family my concern for wound healing and need for further amputations.   Contacts/Participants in Discussion: Primary Decision Maker: Brother Lowanda Foster, and sister Keng Jewel if pt unable to speak for himself   HCPOA: no  Brother and sister as mentioned above and numerous nieces and SO  Code Status/Advance Care Planning:  Full  Symptom Management:   Pain: Continue percocet 1 tab TID and oxycodone 5mg  prn  Additional Recommendations (Limitations, Scope, Preferences):  Psychiatric consult to assess whether pt has capacity . Placed consult after talking with Dr. Vanessa Barbara Psycho-social/Spiritual:   Support System: yes  Desire for further Chaplaincy support:no  Prognosis: < 6 months  Discharge Planning:  Undecided. Family wants to proceed with amputation. Regardless, can no longer live with SO as before and would be dc'd to SNF        Chief Complaint/History of Present Illness: Pt is a 74 yo man with admitted with cellulitis of right heel, now with gangrene.   Primary Diagnoses  Present on Admission:  . (Resolved) Cellulitis  Palliative Review of Systems: Pt reports being in pain I have reviewed the medical record, interviewed the patient and family, and examined the patient. The  following aspects are pertinent.  Past Medical History  Diagnosis Date  . Renal insufficiency     EMS reports "per familiy member "kidney problem"  . Legally blind     per ems  . Chronic alcohol abuse   . H/O enucleation of left eyeball   . Diabetes mellitus without complication    Social History   Social History  . Marital Status: Widowed    Spouse Name: N/A  . Number of Children: N/A  . Years of Education: N/A     Social History Main Topics  . Smoking status: Current Every Day Smoker    Types: Cigarettes  . Smokeless tobacco: None  . Alcohol Use: Yes  . Drug Use: None  . Sexual Activity: Not Asked   Other Topics Concern  . None   Social History Narrative   Family History  Problem Relation Age of Onset  . Family history unknown: Yes   Scheduled Meds: . amLODipine  5 mg Oral Daily  . docusate sodium  100 mg Oral BID  . enoxaparin (LOVENOX) injection  40 mg Subcutaneous Q24H  . feeding supplement (ENSURE ENLIVE)  237 mL Oral TID BM  . oxyCODONE-acetaminophen  1 tablet Oral TID  . piperacillin-tazobactam (ZOSYN)  IV  3.375 g Intravenous Q8H  . vancomycin  1,000 mg Intravenous Q24H   Continuous Infusions: . sodium chloride 75 mL/hr at 03/13/15 0955   PRN Meds:.acetaminophen **OR** acetaminophen, alum & mag hydroxide-simeth, LORazepam, ondansetron **OR** ondansetron (ZOFRAN) IV, oxyCODONE Medications Prior to Admission:  Prior to Admission medications   Not on File   No Known Allergies CBC:    Component Value Date/Time   WBC 14.6* 03/11/2015 0405   HGB 8.7* 03/11/2015 0405   HCT 26.9* 03/11/2015 0405   PLT 517* 03/11/2015 0405   MCV 90.9 03/11/2015 0405   NEUTROABS 18.0* 03/06/2015 1352   LYMPHSABS 0.7 03/06/2015 1352   MONOABS 0.8 03/06/2015 1352   EOSABS 0.0 03/06/2015 1352   BASOSABS 0.0 03/06/2015 1352   Comprehensive Metabolic Panel:    Component Value Date/Time   NA 134* 03/11/2015 0405   K 4.1 03/11/2015 0405   CL 100* 03/11/2015 0405   CO2 25 03/11/2015 0405   BUN 20 03/11/2015 0405   CREATININE 1.08 03/13/2015 0432   GLUCOSE 100* 03/11/2015 0405   CALCIUM 9.7 03/11/2015 0405   CALCIUM 10.1 01/19/2011 2115   AST 29 03/07/2015 0451   ALT 26 03/07/2015 0451   ALKPHOS 71 03/07/2015 0451   BILITOT 0.8 03/07/2015 0451   PROT 6.7 03/07/2015 0451   ALBUMIN 2.9* 03/07/2015 0451    Physical Exam: Vital Signs: BP 163/79 mmHg  Pulse 88  Temp(Src) 97.4 F (36.3  C) (Axillary)  Resp 16  Ht  (1.626 m)  Wt 55.475 kg (122 lb 4.8 oz)  BMI 20.98 kg/m2  SpO2 100% SpO2: SpO2: 100 % O2 Device: O2 Device: Not Delivered O2 Flow Rate:   Intake/output summary:  Intake/Output Summary (Last 24 hours) at 03/13/15 1326 Last data filed at 03/13/15 0900  Gross per 24 hour  Intake    980 ml  Output   2850 ml  Net  -1870 ml   LBM: Last BM Date: 03/13/15 Baseline Weight: Weight: 55.475 kg (122 lb 4.8 oz) Most recent weight: Weight: 55.475 kg (122 lb 4.8 oz)  Exam Findings:  General : Older man, in pain, confused Resp: No work of breathing observed         Palliative Performance Scale: 40  Additional Data Reviewed: Recent Labs     03/11/15  0405  03/13/15  0432  WBC  14.6*   --   HGB  8.7*   --   PLT  517*   --   NA  134*   --   BUN  20   --   CREATININE  1.22  1.08     Time In: 1000 Time Out: 1115  Time Total: 75 min Greater than 50%  of this time was spent counseling and coordinating care related to the above assessment and plan. Staffed with Dr. Vanessa Barbara  Signed by: Irean Hong, NP  Irean Hong, NP  03/13/2015, 1:26 PM  Please contact Palliative Medicine Team phone at [phone] for questions and concerns.

## 2015-03-14 DIAGNOSIS — F4322 Adjustment disorder with anxiety: Secondary | ICD-10-CM

## 2015-03-14 LAB — BASIC METABOLIC PANEL
ANION GAP: 6 (ref 5–15)
BUN: 15 mg/dL (ref 6–20)
CALCIUM: 9.8 mg/dL (ref 8.9–10.3)
CHLORIDE: 106 mmol/L (ref 101–111)
CO2: 24 mmol/L (ref 22–32)
Creatinine, Ser: 1.05 mg/dL (ref 0.61–1.24)
GFR calc non Af Amer: >60 mL/min (ref >60)
Glucose, Bld: 96 mg/dL (ref 65–99)
POTASSIUM: 4.1 mmol/L (ref 3.5–5.1)
Sodium: 136 mmol/L (ref 135–145)

## 2015-03-14 LAB — CBC
HCT: 25.2 % — ABNORMAL LOW (ref 39.0–52.0)
HEMOGLOBIN: 8.6 g/dL — AB (ref 13.0–17.0)
MCH: 30.4 pg (ref 26.0–34.0)
MCHC: 34.1 g/dL (ref 30.0–36.0)
MCV: 89 fL (ref 78.0–100.0)
Platelets: 519 10*3/uL — ABNORMAL HIGH (ref 150–400)
RBC: 2.83 MIL/uL — AB (ref 4.22–5.81)
RDW: 13.3 % (ref 11.5–15.5)
WBC: 14.9 10*3/uL — ABNORMAL HIGH (ref 4.0–10.5)

## 2015-03-14 MED ORDER — AMLODIPINE BESYLATE 10 MG PO TABS
10.0000 mg | ORAL_TABLET | Freq: Every day | ORAL | Status: DC
  Administered 2015-03-15 – 2015-03-18 (×3): 10 mg via ORAL
  Filled 2015-03-14 (×3): qty 1

## 2015-03-14 NOTE — Progress Notes (Signed)
[facility] PROGRESS NOTE  Rodgers Likes Retzloff EZV:[id number] DOB: December 01, 1940 DOA: 03/06/2015 PCP: Charline Bills, MD  HPI/Subjective: [name] is a 74 y.o black male who presented for admission on 03/06/15 for sepsis secondary to L heel wound. PMH includes renal insufficiency, legal blindness, and alcohol abuse. Patient has been previously seen in the ED for treatment, but was unable to fill prescriptions due to transportation issues. Upon admission, his heel pain is much worse. He denies fever, chills, N/V/D, or peripheral edema. Vascular consultation recommended amputation (BKA vs. AKA) based on severe peripheral vascular disease and gangrenous wound, but patient does not have functional capacity to make this decisions. Next of kin has been contacted for surgical decision.   Today the patient appears to be doing well. He is asleep, but will wake up and respond appropriately to questions. He denies fever, chills, N/V/D. Denies urinary complaints. He reports that the pain in his L heel is still improving, but is still noticeable when touched. He reports that his family will be joining him today to discuss plans of care.   Assessment/Plan: 1. Sepsis:  -Secondary to L heel wound. Remains afebrile. -Repeat CBC this morning showed a white count of 14,900. -Remains hemodynamically stable 2. Necrotic left heel ulcer:  -L heel ulceration secondary to severe PAD. Vascular recommends amputation.  -Palliative care consulted and met with patient/family  -Patient has declined amputation, though there is concerns for his capacity to make medical decisions have consulted psychiatry -Meanwhile continue empiric IV antimicrobial therapy with vancomycin and Zosyn 3. PAD:  -Severe disease (ABI shows R .24, L .34.). Plan is to continue to monitor for signs of additional ischemia.  4. ARF:  -A.m. lab work showing stable creatinine of 1.05 with BUN of 15 5. Anemia of chronic disease:  -Hemoglobin is stable.  6. HTN:   -BP's remain elevated I will increase his Norvasc from 5 mg to 10 g by mouth daily 7. Murmur:  -L/R sternal boarder murmur. Echo showed EF of 55-60% with mild LVH and no wall motion abnormalities. Plan is to continue to monitor for complications. Do not anticipate this being a complication to surgery (if that is decided) 8. Goals of care:  -Meeting with palliative care and multiple family members occurred on 03/13/2015.  -There are concerns for patient's capacity to make medical decisions, psychiatry has been consulted -Family members wishing for patient to undergo amputation.  Code Status: Full 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:  Disposition Plan: SNF (regarless of amputation or not)   Consultants:  Palliative care   Vascular   Procedures:  Echocardiogram  Antibiotics:  See below  Anti-infectives    Start     Dose/Rate Route Frequency Ordered Stop   03/11/15 1600  piperacillin-tazobactam (ZOSYN) IVPB 3.375 g     3.375 g 12.5 mL/hr over 240 Minutes Intravenous Every 8 hours 03/11/15 1102     03/11/15 1100  amoxicillin-clavulanate (AUGMENTIN) 875-125 MG per tablet 1 tablet  Status:  Discontinued     1 tablet Oral 2 times daily 03/11/15 0938 03/11/15 1101   03/10/15 1600  vancomycin (VANCOCIN) IVPB 1000 mg/200 mL premix     1,000 mg 200 mL/hr over 60 Minutes Intravenous Every 24 hours 03/09/15 1806     03/07/15 1800  vancomycin (VANCOCIN) IVPB 750 mg/150 ml premix  Status:  Discontinued     750 mg 150 mL/hr over 60 Minutes Intravenous Every 24 hours 03/06/15 1821 03/09/15 1806   03/07/15 0000  piperacillin-tazobactam (ZOSYN) IVPB 3.375 g  Status:  Discontinued  3.375 g 12.5 mL/hr over 240 Minutes Intravenous Every 8 hours 03/06/15 1729 03/11/15 0938   03/06/15 1730  vancomycin (VANCOCIN) IVPB 1000 mg/200 mL premix     1,000 mg 200 mL/hr over 60 Minutes Intravenous  Once 03/06/15 1703 03/06/15 1929   03/06/15 1730  piperacillin-tazobactam (ZOSYN) IVPB 3.375 g     3.375 g 100 mL/hr  over 30 Minutes Intravenous  Once 03/06/15 1729 03/06/15 1858   03/06/15 1630  clindamycin (CLEOCIN) IVPB 600 mg  Status:  Discontinued     600 mg 100 mL/hr over 30 Minutes Intravenous 3 times per day 03/06/15 1620 03/06/15 1635        Objective: Filed Vitals:   03/14/15 0512  BP: 162/86  Pulse: 88  Temp: 99.1 F (37.3 C)  Resp: 14    Intake/Output Summary (Last 24 hours) at 03/14/15 1337 Last data filed at 03/14/15 0830  Gross per 24 hour  Intake 2062.5 ml  Output   2500 ml  Net -437.5 ml   Filed Weights   03/06/15 1716  Weight: 55.475 kg (122 lb 4.8 oz)    Exam: General: Frail-appearing elderly man, sleeping upon arrival, but wakes up when asked, no acute distress HEENT: Patient is legally blind. No redness or erythema in eyes.  Cardiovascular: RRR, no r/g.+Diastolic murmur at LLSB. No peripheral edema noted. Diminished pedal pulses b/l. Respiratory: CTA b/l, no wheeze or rhonchi.  Abdomen: Soft, non-tender, non distended.  Neuro: A&Ox3. Appropriate movement of UE/LE. No focal neurological deficits.  Musculoskeletal: Age-appropriate muscular strength/movement in UE/LE.  1. LE: L. Heel necrotic with tissue debris. Painful to touch. No evidence of abscess or drainage. No evidence of ulceration on R.   Data Reviewed: Basic Metabolic Panel:  Recent Labs Lab 03/08/15 0456 03/09/15 0410 03/10/15 0447 03/11/15 0405 03/13/15 0432 03/14/15 0355  NA 134* 135 133* 134*  --  136  K 4.3 4.5 4.4 4.1  --  4.1  CL 104 99* 100* 100*  --  106  CO2 $Re'23 27 25 25  'rOI$ --  24  GLUCOSE 99 101* 107* 100*  --  96  BUN 24* 26* 22* 20  --  15  CREATININE 1.19 1.27* 1.26* 1.22 1.08 1.05  CALCIUM 9.4 10.1 9.9 9.7  --  9.8   Liver Function Tests: No results for input(s): AST, ALT, ALKPHOS, BILITOT, PROT, ALBUMIN in the last 168 hours. No results for input(s): LIPASE, AMYLASE in the last 168 hours. No results for input(s): AMMONIA in the last 168 hours. CBC:  Recent Labs Lab  03/08/15 0456 03/10/15 0447 03/11/15 0405 03/14/15 0355  WBC 11.5* 13.2* 14.6* 14.9*  HGB 8.2* 8.7* 8.7* 8.6*  HCT 25.1* 26.6* 26.9* 25.2*  MCV 90.9 91.1 90.9 89.0  PLT 412* 454* 517* 519*   Cardiac Enzymes: No results for input(s): CKTOTAL, CKMB, CKMBINDEX, TROPONINI in the last 168 hours. BNP (last 3 results) No results for input(s): BNP in the last 8760 hours.  ProBNP (last 3 results) No results for input(s): PROBNP in the last 8760 hours.  CBG: No results for input(s): GLUCAP in the last 168 hours.  Recent Results (from the past 240 hour(s))  Urine culture     Status: None   Collection Time: 03/06/15  3:31 PM  Result Value Ref Range Status   Specimen Description Urine  Final   Special Requests NONE  Final   Culture   Final    >=100,000 COLONIES/mL AEROCOCCUS URINAE Standardized susceptibility testing for this organism is not  available. Performed at [facility]    Report Status 03/08/2015 FINAL  Final  Culture, blood (routine x 2)     Status: None   Collection Time: 03/06/15  6:45 PM  Result Value Ref Range Status   Specimen Description BLOOD LEFT ARM  Final   Special Requests BOTTLES DRAWN AEROBIC ONLY [place]  Final   Culture   Final    NO GROWTH 5 DAYS Performed at [facility]    Report Status 03/11/2015 FINAL  Final  Culture, blood (routine x 2)     Status: None   Collection Time: 03/06/15  6:50 PM  Result Value Ref Range Status   Specimen Description BLOOD LEFT HAND  Final   Special Requests BOTTLES DRAWN AEROBIC ONLY 10CC  Final   Culture   Final    NO GROWTH 5 DAYS Performed at [facility]    Report Status 03/11/2015 FINAL  Final  C difficile quick scan w PCR reflex     Status: None   Collection Time: 03/10/15 11:02 AM  Result Value Ref Range Status   C Diff antigen NEGATIVE NEGATIVE Final   C Diff toxin NEGATIVE NEGATIVE Final   C Diff interpretation Negative for toxigenic C. difficile  Final     Studies: No results  found.  Scheduled Meds: . amLODipine  5 mg Oral Daily  . docusate sodium  100 mg Oral BID  . enoxaparin (LOVENOX) injection  40 mg Subcutaneous Q24H  . feeding supplement (ENSURE ENLIVE)  237 mL Oral TID BM  . oxyCODONE-acetaminophen  1 tablet Oral TID  . piperacillin-tazobactam (ZOSYN)  IV  3.375 g Intravenous Q8H  . vancomycin  1,000 mg Intravenous Q24H   Continuous Infusions: . sodium chloride 75 mL/hr at 03/13/15 2330    Principal Problem:   Sepsis Active Problems:   ARF (acute renal failure)   Gangrene   Benign essential HTN   Blind in both eyes   Palliative care encounter    Time spent: 25 minutes    Kelvin Cellar, MD  [facility] If 7PM-7AM, please contact night-coverage at [email or web address], password [email or web address] 03/14/2015, 1:37 PM  LOS: 8 days

## 2015-03-14 NOTE — Consult Note (Signed)
[facility] Consult   Reason for Consult:  Capacity evaluation Referring Physician:  Dr. Coralyn Pear Patient Identification: [name] MRN:  [id number] Principal Diagnosis: Sepsis Diagnosis:   Patient Active Problem List   Diagnosis Date Noted  . Palliative care encounter [Z51.5]   . Gangrene [I96] 03/07/2015  . Benign essential HTN [I10] 03/07/2015  . 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 ARF (acute renal failure) [N17.9] 03/06/2015  . Sepsis [A41.9] 03/06/2015  . DM [E11.9] 03/23/2007  . ALCOHOLISM [F10.20] 03/23/2007  . GLAUCOMA NEC [H40.89] 03/23/2007  . CONJUNCTIVITIS NEC [H10.89] 03/23/2007  . HYPERTENSION [I10] 03/23/2007  . GASTROESOPHAGEAL REFLUX DISEASE [K21.9] 03/23/2007  . RIB PAIN, RIGHT SIDED [R07.9] 03/23/2007  . HYPERGLYCEMIA [R73.09] 03/23/2007    Total Time spent with patient: 45 minutes  Subjective:   [name] is a 74 y.o. male patient admitted with gangrene ulcer of foot.  HPI:  [name] is a 74 y.o. Male, legally blind admitted to [facility] for gangrenous left foot and psychiatric consultation requested for capacity evaluation. Met with patient face-to-face for this evaluation and consultation. Patient appeared lying on his bed, calm and cooperative during this evaluation. Patient is awake, alert, oriented to time place and person. Patient stated he feels overwhelmed about making a decision about amputation. At the same time he understood that doctors concerned about his a gangrenous foot  cannot be saved and need to be amputated for his better health. Patient also stated I cannot continue to have pain and I would agree with the doctors and that the same time he wanted his family members including his nieces to be supportive to him. Patient has intact orientation, concentration, memory, a decreased language functions. Patient reportedly has been living with a friend before hospitalized. Patient has no previous history of  psychiatric illness or acute psychiatric hospitalization.   Past Medical History:  Past Medical History  Diagnosis Date  . Renal insufficiency     EMS reports "per familiy member "kidney problem"  . Legally blind     per ems  . Chronic alcohol abuse   . H/O enucleation of left eyeball   . Diabetes mellitus without complication    History reviewed. No pertinent past surgical history. Family History:  Family History  Problem Relation Age of Onset  . Family history unknown: Yes   Social History:  History  Alcohol Use  . Yes     History  Drug Use Not on file    Social History   Social History  . Marital Status: Widowed    Spouse Name: N/A  . Number of Children: N/A  . Years of Education: N/A   Social History Main Topics  . Smoking status: Current Every Day Smoker    Types: Cigarettes  . Smokeless tobacco: None  . Alcohol Use: Yes  . Drug Use: None  . Sexual Activity: Not Asked   Other Topics Concern  . None   Social History Narrative   Additional Social History:                          Allergies:  No Known Allergies  Labs:  Results for orders placed or performed during the hospital encounter of 03/06/15 (from the past 48 hour(s))  Creatinine, serum     Status: None   Collection Time: 03/13/15  4:32 AM  Result Value Ref Range   Creatinine, Ser 1.08 0.61 - 1.24 mg/dL  GFR calc non Af Amer >60 >60 mL/min   GFR calc Af Amer >60 >60 mL/min    Comment: (NOTE) The eGFR has been calculated using the CKD EPI equation. This calculation has not been validated in all clinical situations. eGFR's persistently <60 mL/min signify possible Chronic Kidney Disease.   Basic metabolic panel     Status: None   Collection Time: 03/14/15  3:55 AM  Result Value Ref Range   Sodium 136 135 - 145 mmol/L   Potassium 4.1 3.5 - 5.1 mmol/L   Chloride 106 101 - 111 mmol/L   CO2 24 22 - 32 mmol/L   Glucose, Bld 96 65 - 99 mg/dL   BUN 15 6 - 20 mg/dL   Creatinine, Ser  1.05 0.61 - 1.24 mg/dL   Calcium 9.8 8.9 - 10.3 mg/dL   GFR calc non Af Amer >60 >60 mL/min   GFR calc Af Amer >60 >60 mL/min    Comment: (NOTE) The eGFR has been calculated using the CKD EPI equation. This calculation has not been validated in all clinical situations. eGFR's persistently <60 mL/min signify possible Chronic Kidney Disease.    Anion gap 6 5 - 15  CBC     Status: Abnormal   Collection Time: 03/14/15  3:55 AM  Result Value Ref Range   WBC 14.9 (H) 4.0 - 10.5 K/uL   RBC 2.83 (L) 4.22 - 5.81 MIL/uL   Hemoglobin 8.6 (L) 13.0 - 17.0 g/dL   HCT 25.2 (L) 39.0 - 52.0 %   MCV 89.0 78.0 - 100.0 fL   MCH 30.4 26.0 - 34.0 pg   MCHC 34.1 30.0 - 36.0 g/dL   RDW 13.3 11.5 - 15.5 %   Platelets 519 (H) 150 - 400 K/uL    Vitals: Blood pressure 162/86, pulse 88, temperature 99.1 F (37.3 C), temperature source Oral, resp. rate 14, height $RemoveBe'5\' 4"'lzQazegNc$  (1.626 m), weight 55.475 kg (122 lb 4.8 oz), SpO2 100 %.  Risk to Self: Is patient at risk for suicide?: No Risk to Others:   Prior Inpatient Therapy:   Prior Outpatient Therapy:    Current Facility-Administered Medications  Medication Dose Route Frequency Provider Last Rate Last Dose  . 0.9 %  sodium chloride infusion   Intravenous Continuous Debbe Odea, MD 75 mL/hr at 03/13/15 2330    . acetaminophen (TYLENOL) tablet 650 mg  650 mg Oral Q6H PRN Debbe Odea, MD   650 mg at 03/09/15 2303   Or  . acetaminophen (TYLENOL) suppository 650 mg  650 mg Rectal Q6H PRN Debbe Odea, MD      . alum & mag hydroxide-simeth (MAALOX/MYLANTA) 200-200-20 MG/5ML suspension 30 mL  30 mL Oral Q6H PRN Debbe Odea, MD      . amLODipine (NORVASC) tablet 5 mg  5 mg Oral Daily Debbe Odea, MD   5 mg at 03/14/15 1024  . docusate sodium (COLACE) capsule 100 mg  100 mg Oral BID Debbe Odea, MD   100 mg at 03/14/15 1024  . enoxaparin (LOVENOX) injection 40 mg  40 mg Subcutaneous Q24H Debbe Odea, MD   40 mg at 03/13/15 1851  . feeding supplement (ENSURE  ENLIVE) (ENSURE ENLIVE) liquid 237 mL  237 mL Oral TID BM Debbe Odea, MD   237 mL at 03/12/15 2000  . LORazepam (ATIVAN) tablet 1 mg  1 mg Oral Q4H PRN Acquanetta Chain, DO      . ondansetron [facility]) tablet 4 mg  4 mg Oral Q6H PRN Saima  Rizwan, MD       Or  . ondansetron (ZOFRAN) injection 4 mg  4 mg Intravenous Q6H PRN Debbe Odea, MD      . oxyCODONE (Oxy IR/ROXICODONE) immediate release tablet 5 mg  5 mg Oral Q4H PRN Debbe Odea, MD   5 mg at 03/14/15 0805  . oxyCODONE-acetaminophen (PERCOCET/ROXICET) 5-325 MG per tablet 1 tablet  1 tablet Oral TID Acquanetta Chain, DO   1 tablet at 03/14/15 1024  . piperacillin-tazobactam (ZOSYN) IVPB 3.375 g  3.375 g Intravenous Q8H Minda Ditto, RPH   3.375 g at 03/14/15 0519  . vancomycin (VANCOCIN) IVPB 1000 mg/200 mL premix  1,000 mg Intravenous Q24H Emiliano Dyer, RPH   1,000 mg at 03/13/15 1531    Musculoskeletal: Strength & Muscle Tone: decreased Gait & Station: unable to stand Patient leans: N/A  Psychiatric Specialty Exam: Physical Exam as per history and physical   ROS leg pain No Fever-chills, No Headache, No changes with Vision or hearing, reports vertigo No problems swallowing food or Liquids, No Chest pain, Cough or Shortness of Breath, No Abdominal pain, No Nausea or Vommitting, Bowel movements are regular, No Blood in stool or Urine, No dysuria, No new skin rashes or bruises, No new joints pains-aches,  No new weakness, tingling, numbness in any extremity, No recent weight gain or loss, No polyuria, polydypsia or polyphagia,   A full 10 point Review of Systems was done, except as stated above, all other Review of Systems were negative.  Blood pressure 162/86, pulse 88, temperature 99.1 F (37.3 C), temperature source Oral, resp. rate 14, height $RemoveBe'5\' 4"'nQjbgLEYO$  (1.626 m), weight 55.475 kg (122 lb 4.8 oz), SpO2 100 %.Body mass index is 20.98 kg/(m^2).  General Appearance: Disheveled and Guarded  Eye Contact::  Patient is  legally blind reportedly had a injury to his left eye Planck time ago while working  Speech:  Clear and Coherent and Slow  Volume:  Decreased  Mood:  Euthymic  Affect:  Appropriate, Congruent and Constricted  Thought Process:  Coherent and Goal Directed  Orientation:  Full (Time, Place, and Person)  Thought Content:  WDL  Suicidal Thoughts:  No  Homicidal Thoughts:  No  Memory:  Immediate;   Good Recent;   Good  Judgement:  Intact  Insight:  Fair  Psychomotor Activity:  Decreased  Concentration:  Good  Recall:  Good  Fund of Knowledge:Good  Language: Good  Akathisia:  Negative  Handed:  Right  AIMS (if indicated):     Assets:  Communication Skills Desire for Improvement Housing Leisure Time Resilience Social Support  ADL's:  Impaired  Cognition: WNL  Sleep:      Medical Decision Making: Review of Psycho-Social Stressors (1), Review or order clinical lab tests (1), Established Problem, Worsening (2), Review of Last Therapy Session (1), Review or order medicine tests (1), Review of Medication Regimen & Side Effects (2) and Review of New Medication or Change in Dosage (2)  Treatment Plan Summary: Daily contact with patient to assess and evaluate symptoms and progress in treatment and Medication management  Plan:  Patient has full capacity to make his own medical decisions as per my evaluation today Patient does not meet criteria for psychiatric inpatient admission. Supportive therapy provided about ongoing stressors.  Appreciate psychiatric consultation will sign off today Please contact [phone] or [phone] if needs further assistance   Disposition: Patient will be referred to the out-of-home placement and may needed skilled nursing facility with  rehabilitation.   JONNALAGADDA,JANARDHAHA R. 03/14/2015 11:48 AM

## 2015-03-15 DIAGNOSIS — I739 Peripheral vascular disease, unspecified: Secondary | ICD-10-CM

## 2015-03-15 NOTE — Progress Notes (Signed)
ANTIBIOTIC CONSULT NOTE - FOLLOW UP  Pharmacy Consult for Vancomycin, Zosyn Indication: left foot gangrene  No Known Allergies  Patient Measurements: Height:  (162.6 cm) Weight: 122 lb 4.8 oz (55.475 kg) IBW/kg (Calculated) : 59.2  Vital Signs: Temp: 98.4 F (36.9 C) (09/18 0511) Temp Source: Oral (09/18 0511) BP: 116/58 mmHg (09/18 1042) Pulse Rate: 84 (09/18 0511) Intake/Output from previous day: 09/17 0701 - 09/18 0700 In: 1160 [P.O.:960; IV Piggyback:200] Out: 2450 [Urine:2450]  Labs:  Recent Labs  03/13/15 0432 03/14/15 0355  WBC  --  14.9*  HGB  --  8.6*  PLT  --  519*  CREATININE 1.08 1.05   Estimated Creatinine Clearance: 49.9 mL/min (by C-G formula based on Cr of 1.05). No results for input(s): VANCOTROUGH, VANCOPEAK, VANCORANDOM, GENTTROUGH, GENTPEAK, GENTRANDOM, TOBRATROUGH, TOBRAPEAK, TOBRARND, AMIKACINPEAK, AMIKACINTROU, AMIKACIN in the last 72 hours.    Assessment: 74 y.o. 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 He was given prescriptions for Keflex and Bactrim in ED several days ago but was unable to get prescriptions filled. He now meets sepsis criteria with tachycardia and leukocytosis with necrotic foot ulcer. Foot X-ray negative for osteo, but Vascular surgery recommends BKA vs AKA, pending patient consent - Patient is refusing amputation. Pharmacy is consulted to dose vancomycin/Zosyn for sepsis.  Anti-infectives: 9/9 Clindamycin x 1 9/9 >> Zosyn >> 9/9 >> Vancomycin >>   Micro: 9/9 Blood cxt x2: ngtd 9/9 Urine cxt: >100k Aerococcus Urinae 9/13 C. Diff: negative  Today, 03/15/2015:  Afebrile  WBC elevated  SCr stable  Dose changes/levels: 9/12 VT 1700 = 71mcg/ml on  q24h  (before 4th dose)   Goal of Therapy:  Vancomycin trough level 15-20 mcg/ml Appropriate abx dosing, eradication of infection.   Plan:  Day #10 antibiotics  Continue Zosyn 3.375g IV Q8H infused over 4hrs.  Continue  Vancomycin 1gm IV q24h.  NO need to repeat level at this time, recheck Monday if remains on vancomycin  Will await decision on amputation and decision on abx's based upon that decision   Hessie Knows, PharmD, BCPS Pager [phone] 03/15/2015 12:06 PM

## 2015-03-15 NOTE — Progress Notes (Signed)
[facility] PROGRESS NOTE  [name] ZOX:[id number] DOB: Aug 29, 1940 DOA: 03/06/2015 PCP: Charline Bills, MD  HPI/Subjective: [name] is a 74 y.o black male who presented for admission on 03/06/15 for sepsis secondary to L heel wound. PMH includes renal insufficiency, legal blindness, and alcohol abuse. Patient has been previously seen in the ED for treatment, but was unable to fill prescriptions due to transportation issues. Upon admission, his heel pain is much worse. He denies fever, chills, N/V/D, or peripheral edema. Vascular consultation recommended amputation (BKA vs. AKA) based on severe peripheral vascular disease and gangrenous wound, but patient does not have functional capacity to make this decisions. Next of kin has been contacted for surgical decision.   Today the patient appears to be doing well. He is asleep, but will wake up and respond appropriately to questions. He denies fever, chills, N/V/D. Denies urinary complaints. He reports that the pain in his L heel is still improving, but is still noticeable when touched. He reports that his family will be joining him today to discuss plans of care.   Assessment/Plan: 1. Sepsis:  -Secondary to L heel wound. Remains afebrile. -Repeat CBC this morning showed a white count of 14,900. -Remains hemodynamically stable 2. Necrotic left heel ulcer:  -L heel ulceration secondary to severe PAD. Vascular recommends amputation.  -Palliative care consulted and met with patient/family  -Patient has previously declined amputation, though there is concerns for his capacity to make medical decisions, consulted psychiatry. Patient was seen and evaluated by [name] of Psychiatry on 03/14/2015 who felt that [name] had full capacity to make his own medical decisions. I had discussion with [name] today regarding amputation. He states he is will ing to undergo procedure so [place] as family members are around.  -Will contact Vascular Surgery   -Continue empiric IV antimicrobial therapy with vancomycin and Zosyn 3. PAD:  -Severe disease (ABI shows R .24, L .34.). Plan is to continue to monitor for signs of additional ischemia.  4. ARF:  -A.m. lab work on 03/14/2015 showing stable creatinine of 1.05 with BUN of 15 5. Anemia of chronic disease:  -Hemoglobin is stable.  6. HTN:  -BP's remain elevated I will increase his Norvasc from 5 mg to 10 g by mouth daily -Follow up on blood pressures showing improvement 7. Murmur:  -L/R sternal boarder murmur. Echo showed EF of 55-60% with mild LVH and no wall motion abnormalities. Plan is to continue to monitor for complications. Do not anticipate this being a complication to surgery (if that is decided) 8. Goals of care:  -Meeting with palliative care and multiple family members occurred on 03/13/2015.  -There are concerns for patient's capacity to make medical decisions, psychiatry has been consulted -Family members wishing for patient to undergo amputation.  Code Status: Full 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:  Disposition Plan: SNF (regarless of amputation or not)   Consultants:  Palliative care   Vascular   Procedures:  Echocardiogram  Antibiotics:  See below  Anti-infectives    Start     Dose/Rate Route Frequency Ordered Stop   03/11/15 1600  piperacillin-tazobactam (ZOSYN) IVPB 3.375 g     3.375 g 12.5 mL/hr over 240 Minutes Intravenous Every 8 hours 03/11/15 1102     03/11/15 1100  amoxicillin-clavulanate (AUGMENTIN) 875-125 MG per tablet 1 tablet  Status:  Discontinued     1 tablet Oral 2 times daily 03/11/15 0938 03/11/15 1101   03/10/15 1600  vancomycin (VANCOCIN) IVPB 1000 mg/200 mL premix  1,000 mg 200 mL/hr over 60 Minutes Intravenous Every 24 hours 03/09/15 1806     03/07/15 1800  vancomycin (VANCOCIN) IVPB 750 mg/150 ml premix  Status:  Discontinued     750 mg 150 mL/hr over 60 Minutes Intravenous Every 24 hours 03/06/15 1821 03/09/15 1806   03/07/15 0000   piperacillin-tazobactam (ZOSYN) IVPB 3.375 g  Status:  Discontinued     3.375 g 12.5 mL/hr over 240 Minutes Intravenous Every 8 hours 03/06/15 1729 03/11/15 0938   03/06/15 1730  vancomycin (VANCOCIN) IVPB 1000 mg/200 mL premix     1,000 mg 200 mL/hr over 60 Minutes Intravenous  Once 03/06/15 1703 03/06/15 1929   03/06/15 1730  piperacillin-tazobactam (ZOSYN) IVPB 3.375 g     3.375 g 100 mL/hr over 30 Minutes Intravenous  Once 03/06/15 1729 03/06/15 1858   03/06/15 1630  clindamycin (CLEOCIN) IVPB 600 mg  Status:  Discontinued     600 mg 100 mL/hr over 30 Minutes Intravenous 3 times per day 03/06/15 1620 03/06/15 1635        Objective: Filed Vitals:   03/15/15 1042  BP: 116/58  Pulse:   Temp:   Resp:     Intake/Output Summary (Last 24 hours) at 03/15/15 1304 Last data filed at 03/15/15 1000  Gross per 24 hour  Intake   1157 ml  Output   1600 ml  Net   -443 ml   Filed Weights   03/06/15 1716  Weight: 55.475 kg (122 lb 4.8 oz)    Exam: General: Frail-appearing elderly man, awake and alert, following commands. He is nontoxic appearing. We spoke about undergoing amputation, which he now will consent to.  HEENT: Patient is legally blind. No redness or erythema in eyes.  Cardiovascular: RRR, no r/g.+Diastolic murmur at LLSB. No peripheral edema noted. Diminished pedal pulses b/l. Respiratory: CTA b/l, no wheeze or rhonchi.  Abdomen: Soft, non-tender, non distended.  Neuro: A&Ox3. Appropriate movement of UE/LE. No focal neurological deficits.  Musculoskeletal: Age-appropriate muscular strength/movement in UE/LE.  1. LE: L. Heel necrotic with tissue debris. Painful to touch. No evidence of abscess or drainage. No evidence of ulceration on R. No changes in foot exam over the past several days.   Data Reviewed: Basic Metabolic Panel:  Recent Labs Lab 03/09/15 0410 03/10/15 0447 03/11/15 0405 03/13/15 0432 03/14/15 0355  NA 135 133* 134*  --  136  K 4.5 4.4 4.1   --  4.1  CL 99* 100* 100*  --  106  CO2 $Re'27 25 25  'VAE$ --  24  GLUCOSE 101* 107* 100*  --  96  BUN 26* 22* 20  --  15  CREATININE 1.27* 1.26* 1.22 1.08 1.05  CALCIUM 10.1 9.9 9.7  --  9.8   Liver Function Tests: No results for input(s): AST, ALT, ALKPHOS, BILITOT, PROT, ALBUMIN in the last 168 hours. No results for input(s): LIPASE, AMYLASE in the last 168 hours. No results for input(s): AMMONIA in the last 168 hours. CBC:  Recent Labs Lab 03/10/15 0447 03/11/15 0405 03/14/15 0355  WBC 13.2* 14.6* 14.9*  HGB 8.7* 8.7* 8.6*  HCT 26.6* 26.9* 25.2*  MCV 91.1 90.9 89.0  PLT 454* 517* 519*   Cardiac Enzymes: No results for input(s): CKTOTAL, CKMB, CKMBINDEX, TROPONINI in the last 168 hours. BNP (last 3 results) No results for input(s): BNP in the last 8760 hours.  ProBNP (last 3 results) No results for input(s): PROBNP in the last 8760 hours.  CBG: No results for input(s):  GLUCAP in the last 168 hours.  Recent Results (from the past 240 hour(s))  Urine culture     Status: None   Collection Time: 03/06/15  3:31 PM  Result Value Ref Range Status   Specimen Description Urine  Final   Special Requests NONE  Final   Culture   Final    >=100,000 COLONIES/mL AEROCOCCUS URINAE Standardized susceptibility testing for this organism is not available. Performed at [facility]    Report Status 03/08/2015 FINAL  Final  Culture, blood (routine x 2)     Status: None   Collection Time: 03/06/15  6:45 PM  Result Value Ref Range Status   Specimen Description BLOOD LEFT ARM  Final   Special Requests BOTTLES DRAWN AEROBIC ONLY [place]  Final   Culture   Final    NO GROWTH 5 DAYS Performed at [facility]    Report Status 03/11/2015 FINAL  Final  Culture, blood (routine x 2)     Status: None   Collection Time: 03/06/15  6:50 PM  Result Value Ref Range Status   Specimen Description BLOOD LEFT HAND  Final   Special Requests BOTTLES DRAWN AEROBIC ONLY 10CC  Final   Culture    Final    NO GROWTH 5 DAYS Performed at [facility]    Report Status 03/11/2015 FINAL  Final  C difficile quick scan w PCR reflex     Status: None   Collection Time: 03/10/15 11:02 AM  Result Value Ref Range Status   C Diff antigen NEGATIVE NEGATIVE Final   C Diff toxin NEGATIVE NEGATIVE Final   C Diff interpretation Negative for toxigenic C. difficile  Final     Studies: No results found.  Scheduled Meds: . amLODipine  10 mg Oral Daily  . docusate sodium  100 mg Oral BID  . enoxaparin (LOVENOX) injection  40 mg Subcutaneous Q24H  . feeding supplement (ENSURE ENLIVE)  237 mL Oral TID BM  . oxyCODONE-acetaminophen  1 tablet Oral TID  . piperacillin-tazobactam (ZOSYN)  IV  3.375 g Intravenous Q8H  . vancomycin  1,000 mg Intravenous Q24H   Continuous Infusions: . sodium chloride 75 mL/hr at 03/14/15 1352    Principal Problem:   Sepsis Active Problems:   ARF (acute renal failure)   Gangrene   Benign essential HTN   Blind in both eyes   Palliative care encounter    Time spent: 25 minutes    Kelvin Cellar, MD  [facility] If 7PM-7AM, please contact night-coverage at [email or web address], password [place] 03/15/2015, 1:04 PM  LOS: 9 days

## 2015-03-15 NOTE — Progress Notes (Signed)
Daily Progress Note   Patient Name: [name]       Date: 03/15/2015 DOB: 1941-06-09  Age: 74 y.o. MRN#: [id number] Attending Physician: Jeralyn Bennett, MD Primary Care Physician: Candi Leash, MD Admit Date: 03/06/2015  Reason for Consultation/Follow-up: Establishing goals of care  Subjective: Pt seen by psychiatry. Found to have capacity. Seen in conjunction with Dr. Vanessa Barbara. Pt reporting that he needs his bother and sister's help to decide what to do. I did share with him that Molly Maduro, his brother, and Myriam Jacobson, his sister, during family meeting on 03/13/15 wanted him to go forward with the amputation. He then states " I guess that's what I will do". I did share that he would likely be living in a SNF. Dr. Vanessa Barbara did touch on the degree of amputation meaning it could be above the knee as well as his right leg was showing poor circulation, but that surgical consult could speak more fully to that aspect.  I left consult with the impression that he was moving forward with amputation. Dr Vanessa Barbara stressed that this decision was about what HE would want but pt stated again he would have the surgery Interval Events: none Length of Stay: 9 days  Current Medications: Scheduled Meds:  . amLODipine  10 mg Oral Daily  . docusate sodium  100 mg Oral BID  . enoxaparin (LOVENOX) injection  40 mg Subcutaneous Q24H  . feeding supplement (ENSURE ENLIVE)  237 mL Oral TID BM  . oxyCODONE-acetaminophen  1 tablet Oral TID  . piperacillin-tazobactam (ZOSYN)  IV  3.375 g Intravenous Q8H  . vancomycin  1,000 mg Intravenous Q24H    Continuous Infusions: . sodium chloride 75 mL/hr at 03/14/15 1352    PRN Meds: acetaminophen **OR** acetaminophen, alum & mag hydroxide-simeth, LORazepam, ondansetron **OR** ondansetron (ZOFRAN) IV, oxyCODONE  Palliative Performance Scale: 30%     Vital Signs: BP 132/59 mmHg  Pulse 84  Temp(Src) 98.4 F (36.9 C) (Oral)  Resp 18  Ht  (1.626 m)  Wt 55.475 kg (122 lb  4.8 oz)  BMI 20.98 kg/m2  SpO2 99% SpO2: SpO2: 99 % O2 Device: O2 Device: Not Delivered O2 Flow Rate:    Intake/output summary:  Intake/Output Summary (Last 24 hours) at 03/15/15 1024 Last data filed at 03/15/15 0513  Gross per 24 hour  Intake    720 ml  Output   2000 ml  Net  -1280 ml   LBM:   Baseline Weight: Weight: 55.475 kg (122 lb 4.8 oz) Most recent weight: Weight: 55.475 kg (122 lb 4.8 oz)  Physical Exam: General: Frail older man. He is blind.  Resp: No work of breathing Musculoskeletal: Right hell purple/black tender; cold              Additional Data Reviewed: Recent Labs     03/13/15  0432  03/14/15  0355  WBC   --   14.9*  HGB   --   8.6*  PLT   --   519*  NA   --   136  BUN   --   15  CREATININE  1.08  1.05     Problem List:  Patient Active Problem List   Diagnosis Date Noted  . Palliative care encounter   . Gangrene 03/07/2015  . Benign essential HTN 03/07/2015  . Blind in both eyes 03/07/2015  . ARF (acute renal failure) 03/06/2015  . Sepsis 03/06/2015  . DM 03/23/2007  . ALCOHOLISM 03/23/2007  . GLAUCOMA NEC 03/23/2007  .  CONJUNCTIVITIS NEC 03/23/2007  . HYPERTENSION 03/23/2007  . GASTROESOPHAGEAL REFLUX DISEASE 03/23/2007  . RIB PAIN, RIGHT SIDED 03/23/2007  . HYPERGLYCEMIA 03/23/2007     Palliative Care Assessment & Plan    Code Status:  Full code  Goals of Care:  Move forward with amputation  Symptom Management:  Pain: Cont current pain mgt  Psycho-social/Spiritual:  Desire for further Chaplaincy support:no   Prognosis: Unable to determine Discharge Planning: SNF after surgery   Care plan was discussed with Dr. Vanessa Barbara. No family present this am  Thank you for allowing the Palliative Medicine Team to assist in the care of this patient.   Time In: 0800 Time Out: 0825 Total Time 25 min Prolonged Time Billed  no     Greater than 50%  of this time was spent counseling and coordinating care related to the above  assessment and plan.   Irean Hong, NP  03/15/2015, 10:24 AM  Please contact Palliative Medicine Team phone at [phone] for questions and concerns.

## 2015-03-16 ENCOUNTER — Inpatient Hospital Stay (HOSPITAL_COMMUNITY): Payer: Medicare Other | Admitting: Anesthesiology

## 2015-03-16 ENCOUNTER — Encounter (HOSPITAL_COMMUNITY): Payer: Self-pay | Admitting: Anesthesiology

## 2015-03-16 ENCOUNTER — Encounter (HOSPITAL_COMMUNITY)
Admission: EM | Disposition: A | Discharge status: Skilled Nursing Facility | Payer: Self-pay | Source: Home / Self Care | Attending: Internal Medicine

## 2015-03-16 DIAGNOSIS — L8990 Pressure ulcer of unspecified site, unspecified stage: Secondary | ICD-10-CM | POA: Insufficient documentation

## 2015-03-16 HISTORY — PX: AMPUTATION: SHX166

## 2015-03-16 LAB — BASIC METABOLIC PANEL
Anion gap: 9 (ref 5–15)
BUN: 14 mg/dL (ref 6–20)
CO2: 25 mmol/L (ref 22–32)
CREATININE: 1.12 mg/dL (ref 0.61–1.24)
Calcium: 10 mg/dL (ref 8.9–10.3)
Chloride: 102 mmol/L (ref 101–111)
GFR calc Af Amer: >60 mL/min (ref >60)
GLUCOSE: 98 mg/dL (ref 65–99)
Potassium: 3.9 mmol/L (ref 3.5–5.1)
SODIUM: 136 mmol/L (ref 135–145)

## 2015-03-16 LAB — CBC
HCT: 26.4 % — ABNORMAL LOW (ref 39.0–52.0)
Hemoglobin: 8.8 g/dL — ABNORMAL LOW (ref 13.0–17.0)
MCH: 29.7 pg (ref 26.0–34.0)
MCHC: 33.3 g/dL (ref 30.0–36.0)
MCV: 89.2 fL (ref 78.0–100.0)
PLATELETS: 569 10*3/uL — AB (ref 150–400)
RBC: 2.96 MIL/uL — ABNORMAL LOW (ref 4.22–5.81)
RDW: 13.5 % (ref 11.5–15.5)
WBC: 15 10*3/uL — AB (ref 4.0–10.5)

## 2015-03-16 LAB — GLUCOSE, CAPILLARY
GLUCOSE-CAPILLARY: 93 mg/dL (ref 65–99)
Glucose-Capillary: 82 mg/dL (ref 65–99)

## 2015-03-16 SURGERY — AMPUTATION, ABOVE KNEE
Anesthesia: General | Site: Knee | Laterality: Left

## 2015-03-16 MED ORDER — OXYCODONE HCL 5 MG/5ML PO SOLN: 5.0000 mg | Freq: Once | ORAL | Status: AC | PRN

## 2015-03-16 MED ORDER — ONDANSETRON HCL 4 MG/2ML IJ SOLN
INTRAMUSCULAR | Status: DC | PRN
  Administered 2015-03-16: 4 mg via INTRAVENOUS

## 2015-03-16 MED ORDER — ACETAMINOPHEN 160 MG/5ML PO SOLN
325.0000 mg | ORAL | Status: DC | PRN
  Filled 2015-03-16: qty 20.3

## 2015-03-16 MED ORDER — PROPOFOL 10 MG/ML IV BOLUS
INTRAVENOUS | Status: AC
  Filled 2015-03-16: qty 20

## 2015-03-16 MED ORDER — MORPHINE SULFATE (PF) 2 MG/ML IV SOLN
2.0000 mg | INTRAVENOUS | Status: DC | PRN
  Administered 2015-03-16: 2 mg via INTRAVENOUS
  Filled 2015-03-16 (×3): qty 1

## 2015-03-16 MED ORDER — CEFAZOLIN SODIUM-DEXTROSE 2-3 GM-% IV SOLR: 2.0000 g | INTRAVENOUS | Status: DC

## 2015-03-16 MED ORDER — ACETAMINOPHEN 325 MG PO TABS: 325.0000 mg | ORAL_TABLET | ORAL | Status: DC | PRN

## 2015-03-16 MED ORDER — MIDAZOLAM HCL 5 MG/5ML IJ SOLN
INTRAMUSCULAR | Status: DC | PRN
  Administered 2015-03-16 (×2): 1 mg via INTRAVENOUS

## 2015-03-16 MED ORDER — OXYCODONE HCL 5 MG PO TABS
ORAL_TABLET | ORAL | Status: AC
  Filled 2015-03-16: qty 1

## 2015-03-16 MED ORDER — CEFAZOLIN SODIUM-DEXTROSE 2-3 GM-% IV SOLR
2.0000 g | Freq: Once | INTRAVENOUS | Status: DC
  Filled 2015-03-16: qty 50

## 2015-03-16 MED ORDER — FENTANYL CITRATE (PF) 100 MCG/2ML IJ SOLN
INTRAMUSCULAR | Status: AC
  Filled 2015-03-16: qty 4

## 2015-03-16 MED ORDER — LIDOCAINE HCL (CARDIAC) 20 MG/ML IV SOLN
INTRAVENOUS | Status: DC | PRN
  Administered 2015-03-16: 60 mg via INTRAVENOUS

## 2015-03-16 MED ORDER — MIDAZOLAM HCL 2 MG/2ML IJ SOLN
INTRAMUSCULAR | Status: AC
  Filled 2015-03-16: qty 4

## 2015-03-16 MED ORDER — 0.9 % SODIUM CHLORIDE (POUR BTL) OPTIME
TOPICAL | Status: DC | PRN
  Administered 2015-03-16: 1000 mL

## 2015-03-16 MED ORDER — OXYCODONE HCL 5 MG PO TABS
5.0000 mg | ORAL_TABLET | Freq: Once | ORAL | Status: AC | PRN
  Administered 2015-03-16: 5 mg via ORAL

## 2015-03-16 MED ORDER — PROPOFOL 10 MG/ML IV BOLUS
INTRAVENOUS | Status: DC | PRN
  Administered 2015-03-16: 20 mg via INTRAVENOUS
  Administered 2015-03-16: 30 mg via INTRAVENOUS
  Administered 2015-03-16: 100 mg via INTRAVENOUS

## 2015-03-16 MED ORDER — FENTANYL CITRATE (PF) 250 MCG/5ML IJ SOLN
INTRAMUSCULAR | Status: AC
  Filled 2015-03-16: qty 5

## 2015-03-16 MED ORDER — FENTANYL CITRATE (PF) 100 MCG/2ML IJ SOLN
INTRAMUSCULAR | Status: DC | PRN
  Administered 2015-03-16 (×2): 50 ug via INTRAVENOUS
  Administered 2015-03-16: 150 ug via INTRAVENOUS

## 2015-03-16 MED ORDER — CEFAZOLIN SODIUM-DEXTROSE 2-3 GM-% IV SOLR
INTRAVENOUS | Status: DC | PRN
  Administered 2015-03-16: 2 g via INTRAVENOUS

## 2015-03-16 MED ORDER — SUCCINYLCHOLINE CHLORIDE 20 MG/ML IJ SOLN
INTRAMUSCULAR | Status: DC | PRN
  Administered 2015-03-16: 60 mg via INTRAVENOUS

## 2015-03-16 MED ORDER — FENTANYL CITRATE (PF) 100 MCG/2ML IJ SOLN
25.0000 ug | INTRAMUSCULAR | Status: DC | PRN
  Administered 2015-03-16 (×3): 50 ug via INTRAVENOUS

## 2015-03-16 MED ORDER — SODIUM CHLORIDE 0.9 % IV SOLN
INTRAVENOUS | Status: DC
  Administered 2015-03-16: 11:00:00 via INTRAVENOUS
  Administered 2015-03-16: 500 mL via INTRAVENOUS

## 2015-03-16 MED ORDER — MORPHINE SULFATE (PF) 2 MG/ML IV SOLN
INTRAVENOUS | Status: AC
  Filled 2015-03-16: qty 1

## 2015-03-16 SURGICAL SUPPLY — 47 items
BANDAGE ELASTIC 4 VELCRO ST LF (GAUZE/BANDAGES/DRESSINGS) ×3 IMPLANT
BANDAGE ELASTIC 6 VELCRO ST LF (GAUZE/BANDAGES/DRESSINGS) ×3 IMPLANT
BANDAGE ESMARK 6X9 LF (GAUZE/BANDAGES/DRESSINGS) ×1 IMPLANT
BLADE SAW SAG 29X58X.64 (BLADE) ×3 IMPLANT
BNDG CMPR 9X6 STRL LF SNTH (GAUZE/BANDAGES/DRESSINGS) ×1
BNDG COHESIVE 6X5 TAN STRL LF (GAUZE/BANDAGES/DRESSINGS) ×3 IMPLANT
BNDG ESMARK 6X9 LF (GAUZE/BANDAGES/DRESSINGS) ×3
BNDG GAUZE ELAST 4 BULKY (GAUZE/BANDAGES/DRESSINGS) ×4 IMPLANT
CANISTER SUCTION 2500CC (MISCELLANEOUS) ×3 IMPLANT
CLIP TI MEDIUM 6 (CLIP) ×3 IMPLANT
COVER SURGICAL LIGHT HANDLE (MISCELLANEOUS) ×6 IMPLANT
COVER TABLE BACK 60X90 (DRAPES) ×3 IMPLANT
CUFF TOURNIQUET SINGLE 24IN (TOURNIQUET CUFF) ×2 IMPLANT
DRAIN CHANNEL 19F RND (DRAIN) IMPLANT
DRAPE ORTHO SPLIT 77X108 STRL (DRAPES) ×6
DRAPE PROXIMA HALF (DRAPES) ×3 IMPLANT
DRAPE SURG ORHT 6 SPLT 77X108 (DRAPES) ×2 IMPLANT
DRAPE U-SHAPE 47X51 STRL (DRAPES) ×3 IMPLANT
DRSG ADAPTIC 3X8 NADH LF (GAUZE/BANDAGES/DRESSINGS) ×3 IMPLANT
ELECT REM PT RETURN 9FT ADLT (ELECTROSURGICAL) ×3
ELECTRODE REM PT RTRN 9FT ADLT (ELECTROSURGICAL) ×1 IMPLANT
EVACUATOR SILICONE 100CC (DRAIN) IMPLANT
GAUZE SPONGE 4X4 12PLY STRL (GAUZE/BANDAGES/DRESSINGS) ×6 IMPLANT
GLOVE BIO SURGEON STRL SZ7 (GLOVE) ×3 IMPLANT
GLOVE BIOGEL PI IND STRL 6.5 (GLOVE) IMPLANT
GLOVE BIOGEL PI IND STRL 7.5 (GLOVE) ×1 IMPLANT
GLOVE BIOGEL PI INDICATOR 6.5 (GLOVE) ×2
GLOVE BIOGEL PI INDICATOR 7.5 (GLOVE) ×2
GLOVE SS BIOGEL STRL SZ 6.5 (GLOVE) IMPLANT
GLOVE SUPERSENSE BIOGEL SZ 6.5 (GLOVE) ×2
GOWN STRL REUS W/ TWL LRG LVL3 (GOWN DISPOSABLE) ×3 IMPLANT
GOWN STRL REUS W/TWL LRG LVL3 (GOWN DISPOSABLE) ×9
KIT BASIN OR (CUSTOM PROCEDURE TRAY) ×3 IMPLANT
KIT ROOM TURNOVER OR (KITS) ×3 IMPLANT
NS IRRIG 1000ML POUR BTL (IV SOLUTION) ×3 IMPLANT
PACK GENERAL/GYN (CUSTOM PROCEDURE TRAY) ×3 IMPLANT
PAD ARMBOARD 7.5X6 YLW CONV (MISCELLANEOUS) ×6 IMPLANT
STAPLER VISISTAT 35W (STAPLE) ×3 IMPLANT
STOCKINETTE IMPERVIOUS LG (DRAPES) ×3 IMPLANT
SUT ETHILON 3 0 PS 1 (SUTURE) IMPLANT
SUT SILK 0 TIES 10X30 (SUTURE) ×3 IMPLANT
SUT SILK 2 0 (SUTURE) ×3
SUT SILK 2-0 18XBRD TIE 12 (SUTURE) ×1 IMPLANT
SUT VIC AB 2-0 CT1 18 (SUTURE) ×8 IMPLANT
SUT VIC AB 3-0 SH 18 (SUTURE) IMPLANT
UNDERPAD 30X30 INCONTINENT (UNDERPADS AND DIAPERS) ×3 IMPLANT
WATER STERILE IRR 1000ML POUR (IV SOLUTION) ×3 IMPLANT

## 2015-03-16 NOTE — Progress Notes (Addendum)
ANTIBIOTIC CONSULT NOTE - FOLLOW UP  Pharmacy Consult for Vancomycin, Zosyn Indication: left foot gangrene  No Known Allergies  Patient Measurements: Height:  (162.6 cm) Weight: 122 lb 4.8 oz (55.475 kg) IBW/kg (Calculated) : 59.2  Vital Signs: Temp: 98.4 F (36.9 C) (09/19 0510) Temp Source: Oral (09/19 0510) BP: 118/61 mmHg (09/19 0510) Pulse Rate: 82 (09/19 0510) Intake/Output from previous day: 09/18 0701 - 09/19 0700 In: 5733.3 [P.O.:1797; I.V.:3486.3; IV Piggyback:450] Out: 3775 [Urine:3775]  Labs:  Recent Labs  03/14/15 0355 03/16/15 0502  WBC 14.9* 15.0*  HGB 8.6* 8.8*  PLT 519* 569*  CREATININE 1.05 1.12   Estimated Creatinine Clearance: 46.8 mL/min (by C-G formula based on Cr of 1.12). No results for input(s): VANCOTROUGH, VANCOPEAK, VANCORANDOM, GENTTROUGH, GENTPEAK, GENTRANDOM, TOBRATROUGH, TOBRAPEAK, TOBRARND, AMIKACINPEAK, AMIKACINTROU, AMIKACIN in the last 72 hours.    Assessment: 74 y.o. 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 He was given prescriptions for Keflex and Bactrim in ED several days ago but was unable to get prescriptions filled. He now meets sepsis criteria with tachycardia and leukocytosis with necrotic foot ulcer. Foot X-ray negative for osteo, but Vascular surgery recommends BKA vs AKA. Pharmacy is consulted to dose vancomycin/Zosyn for sepsis.  Per MD notes, pt now agreeable to surgery which is planned for this [facility] at [facility].     Anti-infectives: 9/9 Clindamycin x 1 9/9 >> Zosyn >> 9/9 >> Vancomycin >>   Micro: 9/9 Blood cxt x2: NGF 9/9 Urine cxt: >100k Aerococcus Urinae 9/13 C. Diff: negative antigen, negative toxin  Today, 03/16/2015:  Afebrile  WBC elevated, stable  SCr stable  Dose changes/levels: 9/12 VT 1700 = 73mcg/ml on  q24h  (before 4th dose)   Goal of Therapy:  Vancomycin trough level 15-20 mcg/ml Appropriate abx dosing, eradication of infection.   Plan:  Day #11  antibiotics  Continue Zosyn 3.375g IV Q8H infused over 4hrs.  Continue Vancomycin 1gm IV q24h.  Check vancomycin trough if IV antibiotics continued after surgery

## 2015-03-16 NOTE — Progress Notes (Signed)
PT Cancellation Note  Patient Details Name: [name] MRN: [id number] DOB: 04-Nov-1940   Cancelled Treatment:    Reason Eval/Treat Not Completed: Patient not medically ready; Pt to go to [facility] for BKA vs AKA--will defer PT at until after surgery, please re-order at that time; Thank you   [facility] 03/16/2015, 9:50 AM

## 2015-03-16 NOTE — Anesthesia Procedure Notes (Signed)
Procedure Name: Intubation Date/Time: 03/16/2015 11:33 AM Performed by: Wray Kearns A Pre-anesthesia Checklist: Patient identified, Emergency Drugs available, Suction available, Patient being monitored and Timeout performed Patient Re-evaluated:Patient Re-evaluated prior to inductionOxygen Delivery Method: Circle system utilized Preoxygenation: Pre-oxygenation with 100% oxygen Intubation Type: IV induction Ventilation: Mask ventilation without difficulty Laryngoscope Size: Mac and 4 Grade View: Grade I Tube type: Oral Tube size: 7.5 mm Number of attempts: 1 Airway Equipment and Method: Stylet Placement Confirmation: ETT inserted through vocal cords under direct vision,  positive ETCO2 and breath sounds checked- equal and bilateral Secured at: 23 cm Tube secured with: Tape Dental Injury: Teeth and Oropharynx as per pre-operative assessment

## 2015-03-16 NOTE — Progress Notes (Signed)
Pt picked up by care link and will be transferring to [facility] for surgery by Dr. Claudie Fisherman. Dr. Claudie Fisherman states he was going to contact family. Pt informed of plan. Pt is to return to [facility] after surgery.

## 2015-03-16 NOTE — Interval H&P Note (Signed)
[facility] [place]  History and Physical Update  The patient was interviewed and re-examined.  The patient's previous History and Physical has been reviewed and is unchanged from my consult except for: interval palliative care consult and family meeting.  The patient agrees to proceed with Left above-knee amputation at this point.  I discussed in depth the nature of above-the-knee amputation with the patient, including risks, benefits, and alternatives.  The patient is aware that the risks of above-the-knee amputation include but are not limited to: bleeding, infection, myocardial infarction, stroke, death, failure to heal amputation wound, and possible need for more proximal amputation.  The patient is aware of the risks and agrees proceed forward with the procedure.  Leonides Sake, MD [facility] [place] Office: [phone] Pager: [phone]  03/16/2015, 10:42 AM

## 2015-03-16 NOTE — Op Note (Signed)
    OPERATIVE NOTE   PROCEDURE: left above-the-knee amputation  PRE-OPERATIVE DIAGNOSIS: left foot gangrene  POST-OPERATIVE DIAGNOSIS: same as above  SURGEON: Leonides Sake, MD  ANESTHESIA: general  ESTIMATED BLOOD LOSS: 50 cc  FINDING(S): 1.  Viable muscle in thigh  SPECIMEN(S):  left above-the-knee amputation  INDICATIONS:   [name] is a 74 y.o. male who presents with left heel gangrene.  The patient is scheduled for a left above-the-knee amputation.  I discussed in depth with the patient the risks, benefits, and alternatives to this procedure.  The patient is aware that the risk of this operation included but are not limited to:  bleeding, infection, myocardial infarction, stroke, death, failure to heal amputation wound, and possible need for more proximal amputation.  The patient is aware of the risks and agrees proceed forward with the procedure.  DESCRIPTION: After full informed written consent was obtained from the patient, the patient was brought back to the operating room, and placed supine upon the operating table.  Prior to induction, the patient received IV antibiotics.  The patient was then prepped and draped in the standard fashion for an above-the-knee amputation.  I placed a non-sterile tourniquet on the thigh prior to the procedure.  After obtaining adequate anesthesia, the patient was prepped and draped in the standard fashion for a above-the-knee amputation.  I marked out the anterior and posterior flaps for a fish-mouth type of amputation.  I exsanguinated the leg with an Esmarch bandage and then inflated the tourniquet to 250 mm Hg.   I made the incisions for these flaps, and then dissected through the subcutaneous tissue, fascia, and muscles circumferentially.  I elevated  the periosteal tissue 4 cm more proximal than the anterior skin flap.  I then transected the femur with a power saw at this level.  Then I smoothed out the rough edges of the bone with a rasp.  At  this point, the specimen was passed off the field as the above-the-knee amputation.  At this point, I clamped all visibly bleeding arteries and veins using a combination of suture ligation with Vicryl suture and electrocautery.  The tourniquet was then deflated at this point.  Bleeding continued to be controlled with electrocautery and suture ligature.  The stump was washed off with sterile normal saline and no further active bleeding was noted.  I reapproximated the anterior and posterior fascia  with interrupted stitches of 2-0 Vicryl.  This was completed along the entire length of anterior and posterior fascia until there were no more loose space in the fascial line.  The skin was then  reapproximated with staples.  The stump was washed off and dried.  The incision was dressed with [employer] and  then fluffs were applied.  Kerlix was wrapped around the leg and then gently an ACE wrap was applied.     COMPLICATIONS: none  CONDITION: stable   Leonides Sake, MD [facility] [place] Office: [phone] Pager: [phone]  03/16/2015, 12:22 PM

## 2015-03-16 NOTE — Progress Notes (Signed)
CSW continuing to follow.  CSW received notification that pt is going to [facility] today for surgery.   CSW to follow up following surgery in order to assist with disposition planning.  CSW to continue to follow and complete full psychosocial assessment at that time.   Loletta Specter, MSW, LCSW Clinical Social Work [phone]

## 2015-03-16 NOTE — Transfer of Care (Signed)
Immediate Anesthesia Transfer of Care Note  Patient: [name]  Procedure(s) Performed: Procedure(s): LEFT  ABOVE KNEE AMPUTATION (Left)  Patient Location: PACU  Anesthesia Type:General  Level of Consciousness: awake, oriented, sedated, patient cooperative and responds to stimulation  Airway & Oxygen Therapy: Patient Spontanous Breathing and Patient connected to face mask oxygen  Post-op Assessment: Report given to RN and Post -op Vital signs reviewed and stable  Post vital signs: Reviewed and stable  Last Vitals:  Filed Vitals:   03/16/15 1052  BP: 169/70  Pulse: 79  Temp:   Resp:     Complications: No apparent anesthesia complications
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
## 2015-03-16 NOTE — Progress Notes (Signed)
[facility] PROGRESS NOTE  Josep Luviano Ohlson XBJ:[id number] DOB: September 10, 1940 DOA: 03/06/2015 PCP: Candi Leash, MD  HPI/Subjective: [name] is a 74 y.o black male who presented for admission on 03/06/15 for sepsis secondary to L heel wound. PMH includes renal insufficiency, legal blindness, and alcohol abuse. Patient has been previously seen in the ED for treatment, but was unable to fill prescriptions due to transportation issues. Upon admission, his heel pain is much worse. He denies fever, chills, N/V/D, or peripheral edema. Vascular consultation recommended amputation (BKA vs. AKA) based on severe peripheral vascular disease and gangrenous wound, but it was believed that the patient did not have functional capacity to make this decisions.Psych was consulted and determined the patient has functional capacity. He has decided to proceed with surgery as [place] as family would be available to help him. Waiting to hear back from family.   Today the patient appears to be doing well. He is alert, oriented, and responsive to commands. He denies fever, chills, N/V/D, abdominal pain, or urinary complaints. No cough or SOB. He reports that his foot pain is stable. He says that he is still willing to undergo amputation if his family is around to help take care of him.   Assessment/Plan: 1. Sepsis: Secondary to L heel wound. Remains afebrile. No tachycardia.CBC continues to show increasing leukocytosis (15.0 today) despite being on antimicrobial therapy. He still shows no evidence of secondary infection occuring externally.Hemodynamically stable. Plan is to continue broad spectrum antibiotics and monitor.  2. Necrotic foot ulcer: L heel ulceration secondary to PAD. Vascular recommends amputation. Psych consult indicated that patient has capacity to make decisions on his own. See goals of care below for surgical decisions.  3. PAD: Continued disease (ABI shows R .24, L .34.). Plan is to continue to monitor for signs  of additional ischemia.  4. ARF: BUN/SCr stable today at 14/1.12. Suspected etiology is prerenal d/t improvement w/ fluid therapy. Plan is to continue with fluids. 5. Anemia: Appears to be stable (Hgb: 8.8 today). Plan is to continue to monitor.  6. HTN: BP remains stable at this time (118/61) post increase of Norvasc dose from 5 mg to 10 mg QD. Plan is to continue this dose and monitor appropriately. .  7. Murmur: L/R sternal boarder murmur. Echo showed EF of 55-60% with mild LVH and no wall motion abnormalities. Plan is to continue to monitor for complications. Do not anticipate this being a complication to surgery. 8. Goals of care: Psychiatric consultation indicated that the patient has the capacity to make medical decisions on his own. After discussions with the patient, he has decided that he is willing to proceed with amputation if family is available to help him after. Dr. Imogene Burn (surgery) called the AM to let us know that he had a cancellation and could take the patient to surgery today. After speaking with the patient, he would like to contact his family to make sure they are ok with this plan. Nurse informed surgery of this desire. Surgery will attempt to contact the family. Will make patient NPO right now in anticipation of surgery. Will need to transfer patient to [facility] for surgery if able to reach family. Awaiting additional instructions from surgery.   Code Status: Full 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: None at bedside Disposition Plan: SNF following LLE amputation    Consultants:  Palliative care   Vascular   Procedures:  Echocardiogram  Antibiotics:  See below  Anti-infectives    Start     Dose/Rate Route  Frequency Ordered Stop   03/11/15 1600  piperacillin-tazobactam (ZOSYN) IVPB 3.375 g     3.375 g 12.5 mL/hr over 240 Minutes Intravenous Every 8 hours 03/11/15 1102     03/11/15 1100  amoxicillin-clavulanate (AUGMENTIN) 875-125 MG per tablet 1 tablet  Status:   Discontinued     1 tablet Oral 2 times daily 03/11/15 0938 03/11/15 1101   03/10/15 1600  vancomycin (VANCOCIN) IVPB 1000 mg/200 mL premix     1,000 mg 200 mL/hr over 60 Minutes Intravenous Every 24 hours 03/09/15 1806     03/07/15 1800  vancomycin (VANCOCIN) IVPB 750 mg/150 ml premix  Status:  Discontinued     750 mg 150 mL/hr over 60 Minutes Intravenous Every 24 hours 03/06/15 1821 03/09/15 1806   03/07/15 0000  piperacillin-tazobactam (ZOSYN) IVPB 3.375 g  Status:  Discontinued     3.375 g 12.5 mL/hr over 240 Minutes Intravenous Every 8 hours 03/06/15 1729 03/11/15 0938   03/06/15 1730  vancomycin (VANCOCIN) IVPB 1000 mg/200 mL premix     1,000 mg 200 mL/hr over 60 Minutes Intravenous  Once 03/06/15 1703 03/06/15 1929   03/06/15 1730  piperacillin-tazobactam (ZOSYN) IVPB 3.375 g     3.375 g 100 mL/hr over 30 Minutes Intravenous  Once 03/06/15 1729 03/06/15 1858   03/06/15 1630  clindamycin (CLEOCIN) IVPB 600 mg  Status:  Discontinued     600 mg 100 mL/hr over 30 Minutes Intravenous 3 times per day 03/06/15 1620 03/06/15 1635        Objective: Filed Vitals:   03/16/15 0510  BP: 118/61  Pulse: 82  Temp: 98.4 F (36.9 C)  Resp: 18    Intake/Output Summary (Last 24 hours) at 03/16/15 0828 Last data filed at 03/16/15 0515  Gross per 24 hour  Intake 5733.25 ml  Output   3775 ml  Net 1958.25 ml   Filed Weights   03/06/15 1716  Weight: 55.475 kg (122 lb 4.8 oz)    Exam:   General:  Frail-appearing elderly man, asleep in bed but wake upon stimulation. No acute distress  HEENT: Patient is legally blind. No redness, drainage, or erythema in conjunctiva.   Cardiovascular: RRR, no r/g. + Diastolic murmur at LLSB. No peripheral edema. Diminished pedal pulses b/l (1+)  Respiratory: CTA b/l, no wheeze or rhonchi   Abdomen: Soft, non-tender, non-distended  Neuro: A&Ox3. Appropriate movement of all extremities. No focal neurological deficits   Musculoskeletal:  Age-appropriate muscular strength in all extremities   LLE: L. Heel necrotic with debris. Painful to touch. No evidence of abscess or drainage.   RLE: No ulceration or necrosis noted.   Data Reviewed: Basic Metabolic Panel:  Recent Labs Lab 03/10/15 0447 03/11/15 0405 03/13/15 0432 03/14/15 0355 03/16/15 0502  NA 133* 134*  --  136 136  K 4.4 4.1  --  4.1 3.9  CL 100* 100*  --  106 102  CO2 25 25  --  24 25  GLUCOSE 107* 100*  --  96 98  BUN 22* 20  --  15 14  CREATININE 1.26* 1.22 1.08 1.05 1.12  CALCIUM 9.9 9.7  --  9.8 10.0   Liver Function Tests: No results for input(s): AST, ALT, ALKPHOS, BILITOT, PROT, ALBUMIN in the last 168 hours. No results for input(s): LIPASE, AMYLASE in the last 168 hours. No results for input(s): AMMONIA in the last 168 hours. CBC:  Recent Labs Lab 03/10/15 0447 03/11/15 0405 03/14/15 0355 03/16/15 0502  WBC 13.2* 14.6* 14.9*  15.0*  HGB 8.7* 8.7* 8.6* 8.8*  HCT 26.6* 26.9* 25.2* 26.4*  MCV 91.1 90.9 89.0 89.2  PLT 454* 517* 519* 569*   Cardiac Enzymes: No results for input(s): CKTOTAL, CKMB, CKMBINDEX, TROPONINI in the last 168 hours. BNP (last 3 results) No results for input(s): BNP in the last 8760 hours.  ProBNP (last 3 results) No results for input(s): PROBNP in the last 8760 hours.  CBG: No results for input(s): GLUCAP in the last 168 hours.  Recent Results (from the past 240 hour(s))  Urine culture     Status: None   Collection Time: 03/06/15  3:31 PM  Result Value Ref Range Status   Specimen Description Urine  Final   Special Requests NONE  Final   Culture   Final    >=100,000 COLONIES/mL AEROCOCCUS URINAE Standardized susceptibility testing for this organism is not available. Performed at [facility]    Report Status 03/08/2015 FINAL  Final  Culture, blood (routine x 2)     Status: None   Collection Time: 03/06/15  6:45 PM  Result Value Ref Range Status   Specimen Description BLOOD LEFT ARM  Final    Special Requests BOTTLES DRAWN AEROBIC ONLY 9CC  Final   Culture   Final    NO GROWTH 5 DAYS Performed at [facility]    Report Status 03/11/2015 FINAL  Final  Culture, blood (routine x 2)     Status: None   Collection Time: 03/06/15  6:50 PM  Result Value Ref Range Status   Specimen Description BLOOD LEFT HAND  Final   Special Requests BOTTLES DRAWN AEROBIC ONLY 10CC  Final   Culture   Final    NO GROWTH 5 DAYS Performed at [facility]    Report Status 03/11/2015 FINAL  Final  C difficile quick scan w PCR reflex     Status: None   Collection Time: 03/10/15 11:02 AM  Result Value Ref Range Status   C Diff antigen NEGATIVE NEGATIVE Final   C Diff toxin NEGATIVE NEGATIVE Final   C Diff interpretation Negative for toxigenic C. difficile  Final     Studies: No results found.  Scheduled Meds: . amLODipine  10 mg Oral Daily  . docusate sodium  100 mg Oral BID  . enoxaparin (LOVENOX) injection  40 mg Subcutaneous Q24H  . feeding supplement (ENSURE ENLIVE)  237 mL Oral TID BM  . oxyCODONE-acetaminophen  1 tablet Oral TID  . piperacillin-tazobactam (ZOSYN)  IV  3.375 g Intravenous Q8H  . vancomycin  1,000 mg Intravenous Q24H   Continuous Infusions: . sodium chloride 75 mL/hr at 03/16/15 0609    Principal Problem:   Sepsis Active Problems:   ARF (acute renal failure)   Gangrene   Benign essential HTN   Blind in both eyes   Palliative care encounter   PAD (peripheral artery disease)    Time spent: 25 minutes    Micayla Zeltman, Student-PA  [facility] If 7PM-7AM, please contact night-coverage at [email or web address], password [facility] [place] 03/16/2015, 8:28 AM  LOS: 10 days     Addendum  I personally evaluated patient on 03/16/2015 and agree with the above findings. Patient having a unremarkable night, labs reviewed, remained stable. Had a white count of 15,000. On exam no changes to left heel ulcer. Patient is willing to sign consent for amputation. 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 He tolerated procedure well there are  no immediate complications. Will provide pain management with oxycodone and IV morphine for severe breakthrough pain.

## 2015-03-16 NOTE — Anesthesia Preprocedure Evaluation (Signed)
Anesthesia Evaluation  Patient identified by MRN, date of birth, ID band Patient awake    Reviewed: Allergy & Precautions, NPO status , Patient's Chart, lab work & pertinent test results  History of Anesthesia Complications Negative for: history of anesthetic complications  Airway Mallampati: II  TM Distance: >3 FB Neck ROM: Full    Dental  (+) Edentulous Upper   Pulmonary neg shortness of breath, neg sleep apnea, neg COPD, neg recent URI, Current Smoker,    breath sounds clear to auscultation       Cardiovascular hypertension, Pt. on medications (-) angina+ Peripheral Vascular Disease  (-) Past MI  Rhythm:Regular     Neuro/Psych negative neurological ROS     GI/Hepatic Neg liver ROS, GERD  ,  Endo/Other  diabetes  Renal/GU Renal InsufficiencyRenal disease     Musculoskeletal   Abdominal   Peds  Hematology  (+) anemia ,   Anesthesia Other Findings Etoh, blind  Reproductive/Obstetrics                             Anesthesia Physical Anesthesia Plan  ASA: III  Anesthesia Plan: General   Post-op Pain Management:    Induction: Intravenous  Airway Management Planned: Oral ETT  Additional Equipment: None  Intra-op Plan:   Post-operative Plan: Extubation in OR  Informed Consent: I have reviewed the patients History and Physical, chart, labs and discussed the procedure including the risks, benefits and alternatives for the proposed anesthesia with the patient or authorized representative who has indicated his/her understanding and acceptance.   Dental advisory given  Plan Discussed with: CRNA and Surgeon  Anesthesia Plan Comments:         Anesthesia Quick Evaluation

## 2015-03-17 ENCOUNTER — Encounter (HOSPITAL_COMMUNITY): Payer: Self-pay | Admitting: Vascular Surgery

## 2015-03-17 NOTE — Progress Notes (Signed)
[facility] PROGRESS NOTE  Taher Vannote Mccormick XBM:[id number] DOB: 1940/07/17 DOA: 03/06/2015 PCP: Candi Leash, MD  Interim summary [name] is a 74 y.o black male who presented for admission on 03/06/15 for sepsis secondary to L heel wound. PMH includes renal insufficiency, legal blindness, and alcohol abuse. Patient has been previously seen in the ED for treatment, but was unable to fill prescriptions due to transportation issues. Upon admission, his heel pain is much worse. He denies fever, chills, N/V/D, or peripheral edema. Vascular consultation recommended amputation (BKA vs. AKA) based on severe peripheral vascular disease and gangrenous wound, but it was believed that the patient did not have functional capacity to make this decisions.Psych was consulted and determined the patient has functional capacity. He has decided to proceed with surgery as [name] as family would be available to help him. On 03/16/2015 he underwent above-the-knee amputation, procedure performed by Dr Imogene Burn. He tolerated procedure well there are no immediate complications. IV antibiotic therapy was discontinued. He had been on empiric IV antibiotic therapy with Vancomycin and Zosyn. Plan for discharge to skilled nursing facility in the next 24-48 hours.   Today the patient appears to be doing well. He is alert, oriented, and responsive to commands. He is tolerating by mouth intake.   Assessment/Plan: 1. Sepsis: Secondary to L heel wound. Remains afebrile. No tachycardia.CBC continues to show increasing leukocytosis (15.0 today) despite being on antimicrobial therapy. He still shows no evidence of secondary infection occuring externally.Hemodynamically stable. Status post above-the-knee amputation. Antimicrobial therapy discontinued.  2. Necrotic foot ulcer: L heel ulceration secondary to PAD. Vascular recommends amputation. Psych consult indicated that patient has capacity to make decisions on his own.  As mentioned above patient  was taken to the operating room on 03/16/2015 where he underwent above-the-knee amputation, procedure performed by Dr. Imogene Burn of vascular surgery.  3. PAD: Continued disease (ABI shows R .24, L .34.). Plan is to continue to monitor for signs of additional ischemia.  4. ARF: BUN/SCr stable today at 14/1.12. Suspected etiology is prerenal d/t improvement w/ fluid therapy. Plan is to continue with fluids. 5. Anemia: Appears to be stable (Hgb: 8.8 today). Plan is to continue to monitor.  6. HTN: BP remains stable at this time (118/61) post increase of Norvasc dose from 5 mg to 10 mg QD. Plan is to continue this dose and monitor appropriately. .  7. Murmur: L/R sternal boarder murmur. Echo showed EF of 55-60% with mild LVH and no wall motion abnormalities. Plan is to continue to monitor for complications. Do not anticipate this being a complication to surgery. 8.  Code Status: Full 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: None at bedside Disposition Plan: SNF  in the next 24-48 hours    Consultants:  Palliative care   Vascular   Procedures:  Echocardiogram  Antibiotics:  See below  Anti-infectives    Start     Dose/Rate Route Frequency Ordered Stop   03/16/15 1100  ceFAZolin (ANCEF) IVPB 2 g/50 mL premix  Status:  Discontinued     2 g 100 mL/hr over 30 Minutes Intravenous To [facility]Stay [facility] 03/16/15 1044 03/16/15 1450   03/16/15 1045  ceFAZolin (ANCEF) IVPB 2 g/50 mL premix  Status:  Discontinued     2 g 100 mL/hr over 30 Minutes Intravenous  Once 03/16/15 1034 03/16/15 1054   03/11/15 1600  piperacillin-tazobactam (ZOSYN) IVPB 3.375 g  Status:  Discontinued     3.375 g 12.5 mL/hr over 240 Minutes Intravenous Every 8 hours 03/11/15 1102 03/17/15 0909  03/11/15 1100  amoxicillin-clavulanate (AUGMENTIN) 875-125 MG per tablet 1 tablet  Status:  Discontinued     1 tablet Oral 2 times daily 03/11/15 0938 03/11/15 1101   03/10/15 1600  vancomycin (VANCOCIN) IVPB 1000 mg/200 mL premix  Status:   Discontinued     1,000 mg 200 mL/hr over 60 Minutes Intravenous Every 24 hours 03/09/15 1806 03/17/15 0909   03/07/15 1800  vancomycin (VANCOCIN) IVPB 750 mg/150 ml premix  Status:  Discontinued     750 mg 150 mL/hr over 60 Minutes Intravenous Every 24 hours 03/06/15 1821 03/09/15 1806   03/07/15 0000  piperacillin-tazobactam (ZOSYN) IVPB 3.375 g  Status:  Discontinued     3.375 g 12.5 mL/hr over 240 Minutes Intravenous Every 8 hours 03/06/15 1729 03/11/15 0938   03/06/15 1730  vancomycin (VANCOCIN) IVPB 1000 mg/200 mL premix     1,000 mg 200 mL/hr over 60 Minutes Intravenous  Once 03/06/15 1703 03/06/15 1929   03/06/15 1730  piperacillin-tazobactam (ZOSYN) IVPB 3.375 g     3.375 g 100 mL/hr over 30 Minutes Intravenous  Once 03/06/15 1729 03/06/15 1858   03/06/15 1630  clindamycin (CLEOCIN) IVPB 600 mg  Status:  Discontinued     600 mg 100 mL/hr over 30 Minutes Intravenous 3 times per day 03/06/15 1620 03/06/15 1635        Objective: Filed Vitals:   03/17/15 1357  BP: 131/65  Pulse: 93  Temp: 97.8 F (36.6 C)  Resp: 16    Intake/Output Summary (Last 24 hours) at 03/17/15 1739 Last data filed at 03/17/15 1357  Gross per 24 hour  Intake    555 ml  Output    900 ml  Net   -345 ml   Filed Weights   03/06/15 1716  Weight: 55.475 kg (122 lb 4.8 oz)    Exam:   General:  Frail-appearing elderly man, he was awake, alert, tolerating by mouth intake.  HEENT: Patient is legally blind. No redness, drainage, or erythema in conjunctiva.   Cardiovascular: RRR, no r/g. + Diastolic murmur at LLSB. No peripheral edema. Diminished pedal pulses b/l (1+)  Respiratory: CTA b/l, no wheeze or rhonchi   Abdomen: Soft, non-tender, non-distended  Neuro: A&Ox3. Appropriate movement of all extremities. No focal neurological deficits   Musculoskeletal: Age-appropriate muscular strength in all extremities   LLE: L.  status post above-the-knee amputation  RLE: No ulceration or necrosis  noted.   Data Reviewed: Basic Metabolic Panel:  Recent Labs Lab 03/11/15 0405 03/13/15 0432 03/14/15 0355 03/16/15 0502  NA 134*  --  136 136  K 4.1  --  4.1 3.9  CL 100*  --  106 102  CO2 25  --  24 25  GLUCOSE 100*  --  96 98  BUN 20  --  15 14  CREATININE 1.22 1.08 1.05 1.12  CALCIUM 9.7  --  9.8 10.0   Liver Function Tests: No results for input(s): AST, ALT, ALKPHOS, BILITOT, PROT, ALBUMIN in the last 168 hours. No results for input(s): LIPASE, AMYLASE in the last 168 hours. No results for input(s): AMMONIA in the last 168 hours. CBC:  Recent Labs Lab 03/11/15 0405 03/14/15 0355 03/16/15 0502  WBC 14.6* 14.9* 15.0*  HGB 8.7* 8.6* 8.8*  HCT 26.9* 25.2* 26.4*  MCV 90.9 89.0 89.2  PLT 517* 519* 569*   Cardiac Enzymes: No results for input(s): CKTOTAL, CKMB, CKMBINDEX, TROPONINI in the last 168 hours. BNP (last 3 results) No results for input(s): BNP in  the last 8760 hours.  ProBNP (last 3 results) No results for input(s): PROBNP in the last 8760 hours.  CBG:  Recent Labs Lab 03/16/15 1018 03/16/15 1237  GLUCAP 82 93    Recent Results (from the past 240 hour(s))  C difficile quick scan w PCR reflex     Status: None   Collection Time: 03/10/15 11:02 AM  Result Value Ref Range Status   C Diff antigen NEGATIVE NEGATIVE Final   C Diff toxin NEGATIVE NEGATIVE Final   C Diff interpretation Negative for toxigenic C. difficile  Final     Studies: No results found.  Scheduled Meds: . amLODipine  10 mg Oral Daily  . docusate sodium  100 mg Oral BID  . enoxaparin (LOVENOX) injection  40 mg Subcutaneous Q24H  . feeding supplement (ENSURE ENLIVE)  237 mL Oral TID BM  . oxyCODONE-acetaminophen  1 tablet Oral TID   Continuous Infusions: . sodium chloride 75 mL/hr at 03/17/15 1012  . sodium chloride 500 mL (03/16/15 1607)    Principal Problem:   Sepsis Active Problems:   ARF (acute renal failure)   Gangrene   Benign essential HTN   Blind in both  eyes   Palliative care encounter   PAD (peripheral artery disease)   Pressure ulcer    Time spent: 25 minutes    Jeralyn Bennett, MD  [facility] If 7PM-7AM, please contact night-coverage at [email or web address], password [facility] 03/17/2015, 5:39 PM  LOS: 11 days

## 2015-03-17 NOTE — Progress Notes (Signed)
CSW spoke with pt and pt niece re: SNF placement.  Pt and pt family in agreement.  SNF search initiated and bed offers provided.  Pt niece states that pt and pt family will have decision about SNF in the morning.   CSW to complete full psychosocial assessment tomorrow.  Loletta Specter, MSW, LCSW Clinical Social Work [phone]

## 2015-03-17 NOTE — Anesthesia Postprocedure Evaluation (Signed)
  Anesthesia Post-op Note  Patient: [name]  Procedure(s) Performed: Procedure(s): LEFT  ABOVE KNEE AMPUTATION (Left)  Patient Location: PACU  Anesthesia Type:General  Level of Consciousness: awake  Airway and Oxygen Therapy: Patient Spontanous Breathing and Patient connected to nasal cannula oxygen  Post-op Pain: mild  Post-op Assessment: Post-op Vital signs reviewed, Patient's Cardiovascular Status Stable, Respiratory Function Stable, Patent Airway, No signs of Nausea or vomiting and Pain level controlled              Post-op Vital Signs: Reviewed and stable  Last Vitals:  Filed Vitals:   03/17/15 1357  BP: 131/65  Pulse: 93  Temp: 36.6 C  Resp: 16    Complications: No apparent anesthesia complications

## 2015-03-18 ENCOUNTER — Telehealth: Payer: Self-pay | Admitting: Vascular Surgery

## 2015-03-18 DIAGNOSIS — A419 Sepsis, unspecified organism: Principal | ICD-10-CM

## 2015-03-18 LAB — BASIC METABOLIC PANEL
Anion gap: 7 (ref 5–15)
BUN: 15 mg/dL (ref 6–20)
CHLORIDE: 106 mmol/L (ref 101–111)
CO2: 25 mmol/L (ref 22–32)
CREATININE: 0.91 mg/dL (ref 0.61–1.24)
Calcium: 9.7 mg/dL (ref 8.9–10.3)
GFR calc Af Amer: >60 mL/min (ref >60)
GFR calc non Af Amer: >60 mL/min (ref >60)
GLUCOSE: 113 mg/dL — AB (ref 65–99)
POTASSIUM: 3.7 mmol/L (ref 3.5–5.1)
Sodium: 138 mmol/L (ref 135–145)

## 2015-03-18 LAB — CBC
HEMATOCRIT: 25.1 % — AB (ref 39.0–52.0)
HEMOGLOBIN: 8.4 g/dL — AB (ref 13.0–17.0)
MCH: 29.9 pg (ref 26.0–34.0)
MCHC: 33.5 g/dL (ref 30.0–36.0)
MCV: 89.3 fL (ref 78.0–100.0)
Platelets: 616 10*3/uL — ABNORMAL HIGH (ref 150–400)
RBC: 2.81 MIL/uL — AB (ref 4.22–5.81)
RDW: 13.6 % (ref 11.5–15.5)
WBC: 10.8 10*3/uL — ABNORMAL HIGH (ref 4.0–10.5)

## 2015-03-18 MED ORDER — OXYCODONE HCL 5 MG PO TABS: 5.0000 mg | ORAL_TABLET | ORAL | Status: AC | PRN

## 2015-03-18 MED ORDER — AMLODIPINE BESYLATE 10 MG PO TABS: 10.0000 mg | ORAL_TABLET | Freq: Every day | ORAL | Status: AC

## 2015-03-18 MED ORDER — ENSURE ENLIVE PO LIQD: 237.0000 mL | Freq: Three times a day (TID) | ORAL | Status: AC

## 2015-03-18 MED ORDER — DOCUSATE SODIUM 100 MG PO CAPS: 100.0000 mg | ORAL_CAPSULE | Freq: Two times a day (BID) | ORAL | Status: AC

## 2015-03-18 NOTE — Telephone Encounter (Signed)
-----   Message from Sharee Pimple, RN sent at 03/17/2015 11:20 AM EDT ----- Regarding: Schedule   ----- Message -----    From: Fransisco Hertz, MD    Sent: 03/16/2015  12:23 PM      To: [address]  KASHAWN DIRR [id number] 1940/11/15  Procedure: L AKA  Follow-up: 4 weeks

## 2015-03-18 NOTE — Clinical Social Work Placement (Signed)
   CLINICAL SOCIAL WORK PLACEMENT  NOTE  Date:  03/18/2015  Patient Details  Name: [name] MRN: [id number] Date of Birth: [birth year]  Clinical Social Work is seeking post-discharge placement for this patient at the Skilled  Nursing Facility level of care (*CSW will initial, date and re-position this form in  chart as items are completed):  Yes   Patient/family provided with [facility] Clinical Social Work Department's list of facilities offering this level of care within the geographic area requested by the patient (or if unable, by the patient's family).  Yes   Patient/family informed of their freedom to choose among providers that offer the needed level of care, that participate in Medicare, Medicaid or managed care program needed by the patient, have an available bed and are willing to accept the patient.  Yes   Patient/family informed of [facility]'s ownership interest in [facility] and [facility], as well as of the fact that they are under no obligation to receive care at these facilities.  PASRR submitted to EDS on 03/17/15     PASRR number received on 03/17/15     Existing PASRR number confirmed on       FL2 transmitted to all facilities in geographic area requested by pt/family on 03/17/15     FL2 transmitted to all facilities within larger geographic area on       Patient informed that his/her managed care company has contracts with or will negotiate with certain facilities, including the following:        Yes   Patient/family informed of bed offers received.  Patient chooses bed at [facility]     Physician recommends and patient chooses bed at      Patient to be transferred to   on  .  Patient to be transferred to facility by       Patient family notified on   of transfer.  Name of family member notified:        PHYSICIAN Please sign FL2     Additional Comment:    _______________________________________________ Orson Eva,  LCSW 03/18/2015, 11:19 AM

## 2015-03-18 NOTE — Progress Notes (Signed)
   Daily Progress Note  Will be by later today to check L AKA stump.  Ok to remove dressing earlier if considering tsfr to SNF.  Staples come out in the office in 4 weeks.  Leonides Sake, MD [facility] Office: [phone] Pager: [phone]  03/18/2015, 8:16 AM

## 2015-03-18 NOTE — Clinical Social Work Note (Signed)
Clinical Social Work Assessment- Late Entry  Patient Details  Name: [name] MRN: [id number] Date of Birth: Aug 29, 1940  Date of referral:  03/17/15               Reason for consult:  Discharge Planning                Permission sought to share information with:  Family Supports Permission granted to share information::  Yes, Verbal Permission Granted  Name::     Boyce Medici  Agency::     Relationship::  niece  Contact Information:  [phone]  Housing/Transportation Living arrangements for the past 2 months:  Single Family Home Source of Information:  Patient, Other (Comment Required) (pt niece) Patient Interpreter Needed:  None Criminal Activity/Legal Involvement Pertinent to Current Situation/Hospitalization:  No - Comment as needed Significant Relationships:  Other Family Members Lives with:  Significant Other Do you feel safe going back to the place where you live?  No Need for family participation in patient care:  Yes (Comment)  Care giving concerns:  Pt admitted from home. Pt s/p AKA and will need SNF upon discharge.   Social Worker assessment / plan:  CSW spoke with pt at bedside re: SNF. Pt sleeping at time of visit and did not awake enough for CSW to complete assessment. Pt did state CSW can contact pt niece.  CSW spoke with pt niece, Leta Jungling via telephone. CSW introduced self and explained role. CSW discussed with pt niece that pt would require SNF upon discharge. Pt niece recognized that this would be necessary. Pt niece requested SNF search to [facility] and [facility].   CSW completed FL2 and initiated SNF search to [facility] and [facility].   CSW to follow up with pt and pt niece regarding bed offers.  CSW to continue to follow to provide support and assist with disposition needs.   Employment status:  Retired Database administrator PT Recommendations:  Skilled Nursing Facility Information / Referral to community resources:   Skilled Nursing Facility  Patient/Family's Response to care:  Pt sleeping during time of visit. Will follow up with pt to further discuss SNF. Pt niece supportive and actively involved and plans to engage other family members in decision.  Patient/Family's Understanding of and Emotional Response to Diagnosis, Current Treatment, and Prognosis:  Pt niece aware that pt will need time in SNF as pt recently had AKA surgery.  Emotional Assessment Appearance:  Appears stated age Attitude/Demeanor/Rapport:  Other (pt sleeping at time of visit) Affect (typically observed):  Other (pt sleeping at time of visit) Orientation:  Oriented to Self, Oriented to Place, Oriented to  Time, Oriented to Situation Alcohol / Substance use:  Not Applicable Psych involvement (Current and /or in the community):  No (Comment)  Discharge Needs  Concerns to be addressed:  Discharge Planning Concerns Readmission within the last 30 days:  No Current discharge risk:  Physical Impairment Barriers to Discharge:  No Barriers Identified   KIDD, SUZANNA A, LCSW [phone]

## 2015-03-18 NOTE — Progress Notes (Signed)
   Daily Progress Note  Assessment/Planning: POD #2 s/p L AKA   Viable L AKA stump  ABD pad to incision with stump sock to hold in place  Staples out in office in 4 weeks  Subjective  - 2 Days Post-Op  No complaints  Objective Filed Vitals:   03/17/15 1357 03/17/15 2125 03/18/15 0521 03/18/15 1400  BP: 131/65  163/78 140/72  Pulse: 93 92 90 94  Temp: 97.8 F (36.6 C) 98.9 F (37.2 C) 99.3 F (37.4 C) 98.6 F (37 C)  TempSrc: Oral Oral Oral Oral  Resp: Height:      Weight:      SpO2: 99% 100% 98% 98%    Intake/Output Summary (Last 24 hours) at 03/18/15 1625 Last data filed at 03/18/15 1020  Gross per 24 hour  Intake   1260 ml  Output   1651 ml  Net   -391 ml    VASC  Viable L AKA, mild echymosis in lateral dogear  Laboratory CBC    Component Value Date/Time   WBC 10.8* 03/18/2015 0353   HGB 8.4* 03/18/2015 0353   HCT 25.1* 03/18/2015 0353   PLT 616* 03/18/2015 0353    BMET    Component Value Date/Time   NA 138 03/18/2015 0353   K 3.7 03/18/2015 0353   CL 106 03/18/2015 0353   CO2 25 03/18/2015 0353   GLUCOSE 113* 03/18/2015 0353   BUN 15 03/18/2015 0353   CREATININE 0.91 03/18/2015 0353   CALCIUM 9.7 03/18/2015 0353   CALCIUM 10.1 01/19/2011 2115   GFRNONAA >60 03/18/2015 0353   GFRAA >60 03/18/2015 0353    Leonides Sake, MD [facility] Office: [phone] Pager: [phone]  03/18/2015, 4:25 PM

## 2015-03-18 NOTE — Telephone Encounter (Signed)
Patients wife is still deciding on the best SNF for him to be placed in. She will call me back, dpm

## 2015-03-18 NOTE — Discharge Summary (Signed)
Physician Discharge Summary  [name]:[id number] DOB: April 26, 1941 DOA: 03/06/2015  PCP: Candi Leash, MD  Admit date: 03/06/2015 Discharge date: 03/18/2015  Time spent: > 35 minutes  Recommendations for Outpatient Follow-up:  1.  reassess hemoglobin levels 2. Monitor blood pressures and adjust blood pressure medication accordingly  Discharge Diagnoses:  Principal Problem:   Sepsis Active Problems:   ARF (acute renal failure)   Gangrene   Benign essential HTN   Blind in both eyes   Palliative care encounter   PAD (peripheral artery disease)   Pressure ulcer   Discharge Condition: Stable  Diet recommendation: Regular diet  Filed Weights   03/06/15 1716  Weight: 55.475 kg (122 lb 4.8 oz)    History of present illness:  [name] is a 74 y.o black male who presented for admission on 03/06/15 for sepsis secondary to L heel wound. PMH includes renal insufficiency, legal blindness, and alcohol abuse. Patient has been previously seen in the ED for treatment, but was unable to fill prescriptions due to transportation issues. Upon admission, his heel pain is much worse. He denies fever, chills, N/V/D, or peripheral edema. Vascular consultation recommended amputation (BKA vs. AKA) based on severe peripheral vascular disease and gangrenous wound, but it was believed that the patient did not have functional capacity to make this decisions.Psych was consulted and determined the patient has functional capacity. He has decided to proceed with surgery as [place] as family would be available to help him. On 03/16/2015 he underwent above-the-knee amputation, procedure performed by Dr Imogene Burn. He tolerated procedure well there are no immediate complications. IV antibiotic therapy was discontinued. He had been on empiric IV antibiotic therapy with Vancomycin and Zosyn.  Hospital Course:  1. Sepsis: Secondary to L heel wound. Remains afebrile. No tachycardia.CBC continues to show increasing leukocytosis  (15.0 today) despite being on antimicrobial therapy. He still shows no evidence of secondary infection occuring externally.Hemodynamically stable. Status post above-the-knee amputation. Antimicrobial therapy discontinued.  2. Necrotic foot ulcer: L heel ulceration secondary to PAD. Vascular recommends amputation. Psych consult indicated that patient has capacity to make decisions on his own. As mentioned above patient was taken to the operating room on 03/16/2015 where he underwent above-the-knee amputation, procedure performed by Dr. Imogene Burn of vascular surgery.  3. PAD: Continued disease (ABI shows R .24, L .34.). Plan is to continue to monitor for signs of additional ischemia.  4. ARF: BUN/SCr stable today at 14/1.12. Suspected etiology is prerenal d/t improvement w/ fluid therapy. Plan is to continue with fluids. 5. Anemia: Stable no active bleeding 6. HTN: discharge on norvasc. 7. Murmur: L/R sternal boarder murmur. Echo showed EF of 55-60% with mild LVH and no wall motion abnormalities. Plan is to continue to monitor for complications. Do not anticipate this being a complication to surgery.  Procedures:  Please see above  Consultations:  Vascular: Dr. Imogene Burn  Discharge Exam: Filed Vitals:   03/18/15 0521  BP: 163/78  Pulse: 90  Temp: 99.3 F (37.4 C)  Resp: 16    General: Pt in nad, alert and awake Cardiovascular: rrr, no mrg Respiratory: cta bl, no wheezes  Discharge Instructions   Discharge Instructions    Call MD for:  redness, tenderness, or signs of infection (pain, swelling, redness, odor or green/yellow discharge around incision site)    Complete by:  As directed      Call MD for:  severe uncontrolled pain    Complete by:  As directed      Call MD  for:  temperature >100.4    Complete by:  As directed      Diet - low sodium heart healthy    Complete by:  As directed      Discharge instructions    Complete by:  As directed   Patient will need f/u with orthopaedic  surgeon. Please call to confirm appointment and also will need continue physical therapy at SNF     Increase activity slowly    Complete by:  As directed           Current Discharge Medication List    START taking these medications   Details  amLODipine (NORVASC) 10 MG tablet Take 1 tablet (10 mg total) by mouth daily. Qty: 30 tablet, Refills: 0    docusate sodium (COLACE) 100 MG capsule Take 1 capsule (100 mg total) by mouth 2 (two) times daily. Qty: 10 capsule, Refills: 0    feeding supplement, ENSURE ENLIVE, (ENSURE ENLIVE) LIQD Take 237 mLs by mouth 3 (three) times daily between meals. Qty: 237 mL, Refills: 12    oxyCODONE (OXY IR/ROXICODONE) 5 MG immediate release tablet Take 1 tablet (5 mg total) by mouth every 4 (four) hours as needed for moderate pain. Qty: 30 tablet, Refills: 0       No Known Allergies    The results of significant diagnostics from this hospitalization (including imaging, microbiology, ancillary and laboratory) are listed below for reference.    Significant Diagnostic Studies: Dg Chest Port 1 View  03/12/2015   CLINICAL DATA:  Shortness of breath R06.02 (ICD-10-CM)  Followup shortness of breath. History of infected foot wound on the left.  EXAM: PORTABLE CHEST - 1 VIEW  COMPARISON:  01/20/2011  FINDINGS: Cardiac silhouette is normal in size and configuration. No mediastinal or hilar masses or evidence of adenopathy there are prominent bronchovascular markings, stable. No lung consolidation or edema. No pleural effusion or pneumothorax. Lungs are hyperexpanded.  There are old healed rib fractures on the right.  IMPRESSION: No acute cardiopulmonary disease.   Electronically Signed   By: Amie Portland M.D.   On: 03/12/2015 12:03   Dg Foot Complete Left  03/06/2015   CLINICAL DATA:  Soft tissue wound to the left foot, foot pain  EXAM: LEFT FOOT - COMPLETE 3+ VIEW  COMPARISON:  03/04/2015 similar exam performed at [facility]  FINDINGS: Bones  are subjectively osteopenic. No fracture or dislocation. No radiopaque foreign body. Plantar calcaneal spurring is noted. Vascular calcifications are partly visualized.  IMPRESSION: Subjective osteopenia without acute osseous abnormality.   Electronically Signed   By: Christiana Pellant M.D.   On: 03/06/2015 15:54   Dg Foot Complete Left  03/04/2015   CLINICAL DATA:  Open wound heal  EXAM: LEFT FOOT - COMPLETE 3+ VIEW  COMPARISON:  None.  FINDINGS: Wound on the plantar surface of the heel. Negative for osteomyelitis. There is mild spurring of the heel calcaneus.  Mild degenerative change in the first MTP.  Negative for fracture.  IMPRESSION: Negative for fracture or osteomyelitis.   Electronically Signed   By: Marlan Palau M.D.   On: 03/04/2015 14:56    Microbiology: Recent Results (from the past 240 hour(s))  C difficile quick scan w PCR reflex     Status: None   Collection Time: 03/10/15 11:02 AM  Result Value Ref Range Status   C Diff antigen NEGATIVE NEGATIVE Final   C Diff toxin NEGATIVE NEGATIVE Final   C Diff interpretation Negative for toxigenic C.  difficile  Final     Labs: Basic Metabolic Panel:  Recent Labs Lab 03/13/15 0432 03/14/15 0355 03/16/15 0502 03/18/15 0353  NA  --  136 136 138  K  --  4.1 3.9 3.7  CL  --  106 102 106  CO2  --  GLUCOSE  --  96 98 113*  BUN  --  CREATININE 1.08 1.05 1.12 0.91  CALCIUM  --  9.8 10.0 9.7   Liver Function Tests: No results for input(s): AST, ALT, ALKPHOS, BILITOT, PROT, ALBUMIN in the last 168 hours. No results for input(s): LIPASE, AMYLASE in the last 168 hours. No results for input(s): AMMONIA in the last 168 hours. CBC:  Recent Labs Lab 03/14/15 0355 03/16/15 0502 03/18/15 0353  WBC 14.9* 15.0* 10.8*  HGB 8.6* 8.8* 8.4*  HCT 25.2* 26.4* 25.1*  MCV 89.0 89.2 89.3  PLT 519* 569* 616*   Cardiac Enzymes: No results for input(s): CKTOTAL, CKMB, CKMBINDEX, TROPONINI in the last 168 hours. BNP: BNP  (last 3 results) No results for input(s): BNP in the last 8760 hours.  ProBNP (last 3 results) No results for input(s): PROBNP in the last 8760 hours.  CBG:  Recent Labs Lab 03/16/15 1018 03/16/15 1237  GLUCAP 82 93       Signed:  Penny Pia  [facility] 03/18/2015, 3:06 PM

## 2015-03-18 NOTE — Progress Notes (Signed)
CSW continuing to follow.  CSW followed up with pt, pt niece, and pt sister at bedside.   CSW discussed with pt at bedside regarding SNF as pt was too sleepy yesterday to speak with CSW. Pt states that he is agreeable to SNF.   CSW discussed SNF bed offers with pt, pt niece, and pt sister.   Pt discussed that he wanted to be at a SNF in [facility]. Pt accepts bed offer from [facility] in [place].   CSW contacted [facility] in [facility] and confirmed that facility has bed available for pt today. [facility] is requesting to have an updated PT note as PT has not yet worked with pt since pt surgery. CSW spoke with PT who confirmed that they can work with pt today.   CSW spoke with MD and anticipate discharge today.   CSW to continue to follow to assist with pt discharge to [facility].   Loletta Specter, MSW, LCSW Clinical Social Work [phone]

## 2015-03-18 NOTE — Care Management Important Message (Signed)
Important Message  Patient Details  Name: [name] MRN: [id number] Date of Birth: March 10, 1941   Medicare Important Message Given:  Yes-third notification given    [name] 03/18/2015, 11:13 AMImportant Message  Patient Details  Name: [name] MRN: [id number] Date of Birth: 07-May-1941   Medicare Important Message Given:  Yes-third notification given    [name] 03/18/2015, 11:13 AM

## 2015-03-18 NOTE — Progress Notes (Signed)
Patient and family agree upon skilled nursing facility at discharge. I recommend palliative outpatient follow-up at SNF for ongoing goals of care and symptom management. Palliative team will sign off for inpatient care- please re-consult if his condition changes or we can be of further assistance.  Anderson Malta, DO Palliative Medicine [phone]

## 2015-03-18 NOTE — Evaluation (Signed)
Physical Therapy Evaluation Patient Details Name: [name] MRN: [id number] DOB: 17-Apr-1941 Today's Date: 03/18/2015   History of Present Illness  74 y.o. male with h/o blindness admitted 03/06/15 with L foot pain with ulcer, s/p L AKA 03/16/15.   Clinical Impression  Pt will benefit from SNF level therapies post acute; will continue to follow to address deficits below    Follow Up Recommendations SNF;Supervision/Assistance - 24 hour    Equipment Recommendations  None recommended by PT    Recommendations for Other Services       Precautions / Restrictions Precautions Precautions: Fall      Mobility  Bed Mobility Overal bed mobility: Needs Assistance Bed Mobility: Supine to Sit;Sit to Supine     Supine to sit: Mod assist Sit to supine: Mod assist   General bed mobility comments: assist to scoot, elevate trunk and cues for technique and positioning once back in supine  Transfers                 General transfer comment: pt declined, no drop arm chair in room  Ambulation/Gait                Stairs            Wheelchair Mobility    Modified Rankin (Stroke Patients Only)       Balance Overall balance assessment: Needs assistance Sitting-balance support: Single extremity supported;Bilateral upper extremity supported;No upper extremity supported;Feet unsupported (foot supported) Sitting balance-Leahy Scale: Fair Sitting balance - Comments: posterior LOB with UE/RLE movement  Postural control: Posterior lean     Standing balance comment: NT                             Pertinent Vitals/Pain Pain Assessment: Faces Faces Pain Scale: Hurts even more Pain Location: L residual limb Pain Descriptors / Indicators: Sore Pain Intervention(s): Limited activity within patient's tolerance;Monitored during session;Repositioned    Home Living Family/patient expects to be discharged to:: Skilled nursing facility                       Prior Function                 Hand Dominance        Extremity/Trunk Assessment   Upper Extremity Assessment: Defer to OT evaluation               LLE Deficits / Details: AAROM hip grossly WFL, unable to test strength d/t pain     Communication      Cognition Arousal/Alertness: Awake/alert Behavior During Therapy: Flat affect;Impulsive   Area of Impairment: Problem solving;Following commands;Safety/judgement       Following Commands: Follows one step commands with increased time Safety/Judgement: Decreased awareness of safety   Problem Solving: Difficulty sequencing;Requires tactile cues;Requires verbal cues      General Comments      Exercises General Exercises - Lower Extremity Solomon Arc Quad: AROM;Right;10 reps      Assessment/Plan    PT Assessment Patient needs continued PT services  PT Diagnosis Difficulty walking;Abnormality of gait;Generalized weakness;Acute pain   PT Problem List Decreased strength;Decreased range of motion;Decreased activity tolerance;Decreased balance;Decreased mobility;Pain;Decreased knowledge of use of DME;Decreased safety awareness  PT Treatment Interventions Functional mobility training;Therapeutic activities;Patient/family education;Therapeutic exercise;Balance training   PT Goals (Current goals can be found in the Care Plan section) Acute Rehab PT Goals Patient Stated Goal: none stated PT Goal Formulation:  With patient Time For Goal Achievement: 03/25/15 Potential to Achieve Goals: Fair    Frequency     Barriers to discharge        Co-evaluation               End of Session   Activity Tolerance: No increased pain;Patient tolerated treatment well Patient left: in bed;with call bell/phone within reach;with bed alarm set;with family/visitor present           Time: 1610-9604 PT Time Calculation (min) (ACUTE ONLY): 20 min   Charges:   PT Evaluation $PT Re-evaluation: 1 Procedure     PT G  Codes:        Annalyce Lanpher March 20, 2015, 11:38 AM

## 2015-03-18 NOTE — Clinical Social Work Placement (Signed)
   CLINICAL SOCIAL WORK PLACEMENT  NOTE  Date:  03/18/2015  Patient Details  Name: [name] MRN: [id number] Date of Birth: [birth year]  Clinical Social Work is seeking post-discharge placement for this patient at the Skilled  Nursing Facility level of care (*CSW will initial, date and re-position this form in  chart as items are completed):  Yes   Patient/family provided with [facility] Clinical Social Work Department's list of facilities offering this level of care within the geographic area requested by the patient (or if unable, by the patient's family).  Yes   Patient/family informed of their freedom to choose among providers that offer the needed level of care, that participate in Medicare, Medicaid or managed care program needed by the patient, have an available bed and are willing to accept the patient.  Yes   Patient/family informed of [facility]'s ownership interest in [facility] and [facility], as well as of the fact that they are under no obligation to receive care at these facilities.  PASRR submitted to EDS on 03/17/15     PASRR number received on 03/17/15     Existing PASRR number confirmed on       FL2 transmitted to all facilities in geographic area requested by pt/family on 03/17/15     FL2 transmitted to all facilities within larger geographic area on       Patient informed that his/her managed care company has contracts with or will negotiate with certain facilities, including the following:        Yes   Patient/family informed of bed offers received.  Patient chooses bed at [facility]     Physician recommends and patient chooses bed at      Patient to be transferred to [facility] on 03/18/15.  Patient to be transferred to facility by ambulance [facility])     Patient family notified on 03/18/15 of transfer.  Name of family member notified:  pt notified at bedside and pt niece, Leta Jungling notified via telephone     PHYSICIAN Please sign FL2      Additional Comment:    _______________________________________________ Orson Eva, LCSW 03/18/2015, 3:53 PM

## 2015-03-18 NOTE — Progress Notes (Signed)
Pt for discharge to [facility].   CSW facilitated pt discharge needs including contacting facility, faxing pt discharge information via [facility], discussing with pt at bedside and notifying pt niece, Leta Jungling via telephone, providing RN phone number to call report, and arranging ambulance transport via [facility] for pt to [facility].  No further social work needs identified at this time.  CSW signing off.   Loletta Specter, MSW, LCSW Clinical Social Work [phone]

## 2015-03-20 ENCOUNTER — Telehealth: Payer: Self-pay | Admitting: Vascular Surgery

## 2015-03-20 NOTE — Telephone Encounter (Signed)
Unable to reach pt, wife states hes in a SNF- but could not recall the name of the facility. She wcb with the information, dpm

## 2015-03-20 NOTE — Telephone Encounter (Signed)
-----   Message from Sharee Pimple, RN sent at 03/18/2015  5:24 PM EDT ----- Regarding: Schedule   ----- Message -----    From: Fransisco Hertz, MD    Sent: 03/18/2015   4:26 PM      To: [address]  [place] [id number] 1940-12-28  Follow-up: 4 weeks for staples removal from L AKA

## 2015-04-17 ENCOUNTER — Encounter: Payer: Self-pay | Admitting: Vascular Surgery

## 2015-04-22 ENCOUNTER — Encounter: Payer: Self-pay | Admitting: Vascular Surgery

## 2015-04-22 ENCOUNTER — Ambulatory Visit (INDEPENDENT_AMBULATORY_CARE_PROVIDER_SITE_OTHER): Payer: Medicare Other | Admitting: Vascular Surgery

## 2015-04-22 VITALS — BP 115/70 | HR 82 | Temp 96.9°F | Resp 16 | Ht 64.0 in | Wt 120.0 lb

## 2015-04-22 DIAGNOSIS — Z89619 Acquired absence of unspecified leg above knee: Secondary | ICD-10-CM | POA: Insufficient documentation

## 2015-04-22 DIAGNOSIS — Z89612 Acquired absence of left leg above knee: Secondary | ICD-10-CM

## 2015-04-22 DIAGNOSIS — I70269 Atherosclerosis of native arteries of extremities with gangrene, unspecified extremity: Secondary | ICD-10-CM | POA: Insufficient documentation

## 2015-04-22 DIAGNOSIS — I70262 Atherosclerosis of native arteries of extremities with gangrene, left leg: Secondary | ICD-10-CM

## 2015-04-22 NOTE — Progress Notes (Signed)
    Postoperative Visit   History of Present Illness  [name] is a 74 y.o. male who presents for postoperative follow-up for: L above-the-knee amputation (Date: 03/16/15).  The patient's wounds are healed.  The patient notes pain is well controlled.  The patient's current symptoms are: none.  For VQI Use Only  PRE-ADM LIVING: Nursing home  AMB STATUS: Ambulatory  Physical Examination  Filed Vitals:   04/22/15 1340  BP: 115/70  Pulse: 82  Temp: 96.9 F (36.1 C)  Resp: 16   LLE: Incision is healed.  Staples are intact, some scab residual in some areas of the staple line  Medical Decision Making  [name] is a 74 y.o. male who presents s/p Left above-the-knee ampuation.  The patient's stump is healing appropriately with resolution of pre-operative symptoms. I discussed in depth with the patient the nature of atherosclerosis, and emphasized the importance of maximal medical management including strict control of blood pressure, blood glucose, and lipid levels, obtaining regular exercise, and cessation of smoking.  The patient is aware that without maximal medical management the underlying atherosclerotic disease process will progress, possibly leading to a more proximal amputation. The patient agrees to participate in their maximal medical care.  Thank you for allowing us to participate in this patient's care.  The patient has been referred for prosthetic fitting per his wishes.  It would be optimal if he would continue to ambulate with the prosthesis given his history of sacral decubitus ulcer.  The patient can follow up with us as needed.  Leonides SakeBrian Chen, MD [facility] Office: [phone] Pager: [phone]  04/22/2015, 2:05 PM

## 2015-04-24 ENCOUNTER — Encounter: Payer: Self-pay | Admitting: Vascular Surgery

## 2015-05-15 ENCOUNTER — Encounter: Payer: Self-pay | Admitting: Vascular Surgery

## 2015-05-19 ENCOUNTER — Emergency Department (HOSPITAL_COMMUNITY)
Admission: EM | Admit: 2015-05-19 | Discharge: 2015-05-19 | Disposition: A | Discharge status: Home / Self Care | Payer: Medicare Other | Attending: Emergency Medicine | Admitting: Emergency Medicine

## 2015-05-19 ENCOUNTER — Encounter (HOSPITAL_COMMUNITY): Payer: Self-pay

## 2015-05-19 DIAGNOSIS — H548 Legal blindness, as defined in USA: Secondary | ICD-10-CM | POA: Insufficient documentation

## 2015-05-19 DIAGNOSIS — Z79899 Other long term (current) drug therapy: Secondary | ICD-10-CM | POA: Diagnosis not present

## 2015-05-19 DIAGNOSIS — L988 Other specified disorders of the skin and subcutaneous tissue: Secondary | ICD-10-CM | POA: Insufficient documentation

## 2015-05-19 DIAGNOSIS — Z87448 Personal history of other diseases of urinary system: Secondary | ICD-10-CM | POA: Insufficient documentation

## 2015-05-19 DIAGNOSIS — F17210 Nicotine dependence, cigarettes, uncomplicated: Secondary | ICD-10-CM | POA: Insufficient documentation

## 2015-05-19 DIAGNOSIS — E119 Type 2 diabetes mellitus without complications: Secondary | ICD-10-CM | POA: Diagnosis not present

## 2015-05-19 DIAGNOSIS — Z4801 Encounter for change or removal of surgical wound dressing: Secondary | ICD-10-CM | POA: Diagnosis present

## 2015-05-19 DIAGNOSIS — S81802A Unspecified open wound, left lower leg, initial encounter: Secondary | ICD-10-CM

## 2015-05-19 LAB — CBC WITH DIFFERENTIAL/PLATELET
BASOS ABS: 0 10*3/uL (ref 0.0–0.1)
Basophils Relative: 0 %
Eosinophils Absolute: 0.4 10*3/uL (ref 0.0–0.7)
Eosinophils Relative: 4 %
HEMATOCRIT: 29.5 % — AB (ref 39.0–52.0)
Hemoglobin: 9.4 g/dL — ABNORMAL LOW (ref 13.0–17.0)
LYMPHS ABS: 2.2 10*3/uL (ref 0.7–4.0)
LYMPHS PCT: 21 %
MCH: 28.2 pg (ref 26.0–34.0)
MCHC: 31.9 g/dL (ref 30.0–36.0)
MCV: 88.6 fL (ref 78.0–100.0)
MONO ABS: 0.6 10*3/uL (ref 0.1–1.0)
Monocytes Relative: 6 %
NEUTROS ABS: 7.4 10*3/uL (ref 1.7–7.7)
Neutrophils Relative %: 69 %
Platelets: 369 10*3/uL (ref 150–400)
RBC: 3.33 MIL/uL — ABNORMAL LOW (ref 4.22–5.81)
RDW: 14.1 % (ref 11.5–15.5)
WBC: 10.8 10*3/uL — ABNORMAL HIGH (ref 4.0–10.5)

## 2015-05-19 LAB — BASIC METABOLIC PANEL
ANION GAP: 6 (ref 5–15)
BUN: 23 mg/dL — ABNORMAL HIGH (ref 6–20)
CALCIUM: 10.8 mg/dL — AB (ref 8.9–10.3)
CO2: 29 mmol/L (ref 22–32)
Chloride: 100 mmol/L — ABNORMAL LOW (ref 101–111)
Creatinine, Ser: 0.97 mg/dL (ref 0.61–1.24)
GFR calc Af Amer: >60 mL/min (ref >60)
GFR calc non Af Amer: >60 mL/min (ref >60)
GLUCOSE: 110 mg/dL — AB (ref 65–99)
Potassium: 4.6 mmol/L (ref 3.5–5.1)
Sodium: 135 mmol/L (ref 135–145)

## 2015-05-19 LAB — SEDIMENTATION RATE: Sed Rate: 132 mm/hr — ABNORMAL HIGH (ref 0–16)

## 2015-05-19 MED ORDER — OXYCODONE-ACETAMINOPHEN 5-325 MG PO TABS
1.0000 | ORAL_TABLET | Freq: Once | ORAL | Status: AC
  Administered 2015-05-19: 1 via ORAL
  Filled 2015-05-19: qty 1

## 2015-05-19 MED ORDER — COLLAGENASE 250 UNIT/GM EX OINT
TOPICAL_OINTMENT | Freq: Every day | CUTANEOUS | Status: DC
  Administered 2015-05-19: 18:00:00 via TOPICAL
  Filled 2015-05-19: qty 30

## 2015-05-19 NOTE — Consult Note (Signed)
Hospital Consult    Reason for Consult:  Painful left stump Referring Physician:  ED MRN #:  [id number]  History of Present Illness: This is a 74 y.o. male who was admitted in September with c/o painful heel and crying until the dressing was removed.   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 Imogene Burnhen.  The pt was discharged to SNF.  He followed up with Dr. Imogene Burnhen on 04/22/15 at which time his wounds were healed and his staples removed.    The pt is legally blind.  The pt tells me that he has been wearing a stump shrinker for a couple of weeks and had not been removed until today to clean his leg.  When it was removed, the wound was evaluated and he was sent to the ER for further evaluation.  He states he has been having pain with the stump shrinker.  He doesn't recall if he has had any fevers.   Past Medical History  Diagnosis Date  . Renal insufficiency     EMS reports "per familiy member "kidney problem"  . Legally blind     per ems  . Chronic alcohol abuse   . H/O enucleation of left eyeball   . Diabetes mellitus without complication [facility](HCC)     Past Surgical History  Procedure Laterality Date  . Amputation Left 03/16/2015    Procedure: LEFT  ABOVE KNEE AMPUTATION;  Surgeon: Fransisco HertzBrian L Chen, MD;  [facility];  Service: Vascular;  Laterality: Left;    No Known Allergies  Prior to Admission medications   Medication Sig Start Date End Date Taking? Authorizing Provider  amLODipine (NORVASC) 10 MG tablet Take 1 tablet (10 mg total) by mouth daily. 03/18/15  Yes Penny Piarlando Vega, MD  Calcium Carbonate-Vitamin D (CALCIUM-D) 600-400 MG-UNIT TABS Take 1 tablet by mouth 2 (two) times daily.   Yes Historical Provider, MD  feeding supplement, ENSURE ENLIVE, (ENSURE ENLIVE) LIQD Take 237 mLs by mouth 3 (three) times daily between meals. 03/18/15  Yes Penny Piarlando Vega, MD  fentaNYL (DURAGESIC - DOSED MCG/HR) 12 MCG/HR Place 12.5 mcg onto the skin every 3 (three) days.   Yes Historical  Provider, MD  mirtazapine (REMERON) 15 MG tablet Take 15 mg by mouth at bedtime.   Yes Historical Provider, MD  Multiple Vitamin (MULTIVITAMIN WITH MINERALS) TABS tablet Take 1 tablet by mouth daily.   Yes Historical Provider, MD  oxyCODONE (OXY IR/ROXICODONE) 5 MG immediate release tablet Take 1 tablet (5 mg total) by mouth every 4 (four) hours as needed for moderate pain. 03/18/15  Yes Penny Piarlando Vega, MD  polyethylene glycol (MIRALAX / GLYCOLAX) packet Take 17 g by mouth daily.   Yes Historical Provider, MD  sertraline (ZOLOFT) 25 MG tablet Take 25 mg by mouth daily.  04/11/15  Yes Historical Provider, MD  docusate sodium (COLACE) 100 MG capsule Take 1 capsule (100 mg total) by mouth 2 (two) times daily. Patient not taking: Reported on 05/19/2015 03/18/15   Penny Piarlando Vega, MD    Social History   Social History  . Marital Status: Widowed    Spouse Name: N/A  . Number of Children: N/A  . Years of Education: N/A   Occupational History  . Not on file.   Social History Main Topics  . Smoking status: Light Tobacco Smoker    Types: Cigarettes  . Smokeless tobacco: Never Used  . Alcohol Use: Yes  . Drug Use: No  . Sexual Activity: Not on file  Other Topics Concern  . Not on file   Social History Narrative     Family History  Problem Relation Age of Onset  . Family history unknown: Yes    ROS:  Positive    Negative    All sytems reviewed and are negative See HPI  Cardiovascular:  chest pain/pressure  palpitations  SOB lying flat  DOE  pain in legs while walking  pain in legs at rest  pain in legs at night  non-healing ulcers  hx of DVT  swelling in legs  Pulmonary:  productive cough  asthma/wheezing  home O2  Neurologic:  weakness in  arms  legs  numbness in  arms  legs  hx of CVA  mini stroke difficulty speaking or slurred speech  temporary loss of vision in one eye  dizziness  Hematologic:  hx of  cancer  bleeding problems  problems with blood clotting easily  Endocrine:    diabetes  thyroid disease  GI  vomiting blood  blood in stool  GU:  CKD/renal failure  HD--[]  M/W/F or  T/T/S  burning with urination  blood in urine  Psychiatric:  anxiety  depression  Musculoskeletal:  arthritis  joint pain  Integumentary:  rashes  ulcers  Constitutional:  fever  chills   Physical Examination  Filed Vitals:   05/19/15 1445 05/19/15 1500  BP: 100/61 120/83  Pulse: 66 72  Temp:    Resp:     There is no weight on file to calculate BMI.  General:  WDWN in NAD HENT: WNL, normocephalic Pulmonary: normal non-labored breathing Extremities:  Incision line is completely dehisced except for about 1 cm with fibrinous tissue present.  There is scabbing around the periphery of the wound.  Does not appear infected.   CBC    Component Value Date/Time   WBC 10.8* 03/18/2015 0353   RBC 2.81* 03/18/2015 0353   RBC 3.19* 03/07/2015 1200   HGB 8.4* 03/18/2015 0353   HCT 25.1* 03/18/2015 0353   PLT 616* 03/18/2015 0353   MCV 89.3 03/18/2015 0353   MCH 29.9 03/18/2015 0353   MCHC 33.5 03/18/2015 0353   RDW 13.6 03/18/2015 0353   LYMPHSABS 0.7 03/06/2015 1352   MONOABS 0.8 03/06/2015 1352   EOSABS 0.0 03/06/2015 1352   BASOSABS 0.0 03/06/2015 1352    BMET    Component Value Date/Time   NA 138 03/18/2015 0353   K 3.7 03/18/2015 0353   CL 106 03/18/2015 0353   CO2 25 03/18/2015 0353   GLUCOSE 113* 03/18/2015 0353   BUN 15 03/18/2015 0353   CREATININE 0.91 03/18/2015 0353   CALCIUM 9.7 03/18/2015 0353   CALCIUM 10.1 01/19/2011 2115   GFRNONAA >60 03/18/2015 0353   GFRAA >60 03/18/2015 0353    COAGS: Lab Results  Component Value Date   INR 0.97 01/19/2011     Non-Invasive Vascular Imaging:   none  Statin:  No. Beta Blocker:  No. Aspirin:  No. ACEI:  No. ARB:  No. Other antiplatelets/anticoagulants:  No.     ASSESSMENT/PLAN: This is a 74 y.o. male s/p left AKA 03/16/15 by Dr. Imogene Burn now with dehisced wound.   -wound does not appear infected -would use santyl to wound daily with wet to dry dressing -hold stump shrinker until wound heals  -f/u with Dr. Imogene Burn in 1-2 weeks for wound check   Doreatha Massed, PA-C [facility] [phone]  Open wound left AkA with fibrinous exudate.  No erythema no  purulence or obvious abscess. Wound recommend Santyl ointment to wound once daily cover with gauze. Follow up with Dr Imogene Burn No shrinker on stump for now  Fabienne Bruns, MD [facility] [place] Office: [phone] Pager: [phone]

## 2015-05-19 NOTE — ED Notes (Signed)
Spoke with Leta JunglingMarcia about patient's care. Verified understanding. No further questions.

## 2015-05-19 NOTE — Discharge Instructions (Signed)
Delayed Wound Closure °Sometimes, your health care provider will decide to delay closing a wound for several days. This is done when the wound is badly bruised, dirty, or when it has been several hours since the injury happened. By delaying the closure of your wound, the risk of infection is reduced. Wounds that are closed in 3-7 days after being cleaned up and dressed heal just as well as those that are closed right away. °HOME CARE INSTRUCTIONS °· Rest and elevate the injured area until the pain and swelling are gone. °· Have your wound checked as instructed by your health care provider. °SEEK MEDICAL CARE IF: °· You develop unusual or increased swelling or redness around the wound. °· You have increasing pain or tenderness. °· There is increasing fluid (drainage) or a bad smelling drainage coming from the wound. °  °This information is not intended to replace advice given to you by your health care provider. Make sure you discuss any questions you have with your health care provider. °  °Document Released: 06/13/2005 Document Revised: 06/18/2013 Document Reviewed: 12/11/2012 °Elsevier Interactive Patient Education ©2016 [employer] ° °

## 2015-05-19 NOTE — ED Notes (Signed)
PTAR at bedside for transportation.  

## 2015-05-19 NOTE — ED Notes (Signed)
Patient's Niece  Leta JunglingMARCIA [phone]

## 2015-05-19 NOTE — ED Notes (Signed)
Sent Note to order medication.

## 2015-05-19 NOTE — ED Notes (Signed)
MD at the bedside  

## 2015-05-19 NOTE — Consult Note (Addendum)
WOC consult for stump wound requested prior to [facility]VS involvement. Pt had surgery recently and their team plans to follow as an outpatient. [facility] team has assessed wound and recommends Santyl dressings daily for topical treatment.  Please refer to their progress notes for further plan of care. Please re-consult if further assistance is needed.  Thank-you,  Cammie Mcgeeawn Lisel Siegrist MSN, RN, CWOCN, MercerWCN-AP, CNS [phone]

## 2015-05-19 NOTE — ED Notes (Signed)
Wound Nurse paged for a consult,

## 2015-05-19 NOTE — ED Notes (Signed)
Per PT[facility], [facility] [place]. Patient had Left AKA done on 04/22/15. Pt has had no problems since the surgery. Today, pt woke up with pain in left leg. RN looked at leg and reported erythema with mild serous drainage noted. No puss or granulation. Reports patient has no fever noted. Patient was given tw0 5-325 mg of Percocet with relief of pain down to a 2/10. Vitals per PTAR: 110/68, 95% on RA, 81 HR, 16 RR.

## 2015-05-19 NOTE — ED Provider Notes (Signed)
CSN: [id number]     Arrival date & time 05/19/15  1248 History   First MD Initiated Contact with Patient 05/19/15 1354     Chief Complaint  Patient presents with  . Wound Check     (Consider location/radiation/quality/duration/timing/severity/associated sxs/prior Treatment) Patient is a 74 y.o. male presenting with wound check. The history is provided by the patient.  Wound Check This is a chronic problem. Episode onset: surgery 2 months ago, last check 1 month ago with no open wound. The problem occurs constantly. The problem has been gradually worsening. Nothing aggravates the symptoms. Nothing relieves the symptoms. He has tried nothing for the symptoms.    Past Medical History  Diagnosis Date  . Renal insufficiency     EMS reports "per familiy member "kidney problem"  . Legally blind     per ems  . Chronic alcohol abuse   . H/O enucleation of left eyeball   . Diabetes mellitus without complication [facility](HCC)    Past Surgical History  Procedure Laterality Date  . Amputation Left 03/16/2015    Procedure: LEFT  ABOVE KNEE AMPUTATION;  Surgeon: Fransisco HertzBrian L Chen, MD;  [facility];  Service: Vascular;  Laterality: Left;   Family History  Problem Relation Age of Onset  . Family history unknown: Yes   Social History  Substance Use Topics  . Smoking status: Light Tobacco Smoker    Types: Cigarettes  . Smokeless tobacco: Never Used  . Alcohol Use: Yes    Review of Systems  All other systems reviewed and are negative.     Allergies  Review of patient's allergies indicates no known allergies.  Home Medications   Prior to Admission medications   Medication Sig Start Date End Date Taking? Authorizing Provider  amLODipine (NORVASC) 10 MG tablet Take 1 tablet (10 mg total) by mouth daily. 03/18/15  Yes Penny Piarlando Vega, MD  Calcium Carbonate-Vitamin D (CALCIUM-D) 600-400 MG-UNIT TABS Take 1 tablet by mouth 2 (two) times daily.   Yes Historical Provider, MD  feeding supplement,  ENSURE ENLIVE, (ENSURE ENLIVE) LIQD Take 237 mLs by mouth 3 (three) times daily between meals. 03/18/15  Yes Penny Piarlando Vega, MD  fentaNYL (DURAGESIC - DOSED MCG/HR) 12 MCG/HR Place 12.5 mcg onto the skin every 3 (three) days.   Yes Historical Provider, MD  mirtazapine (REMERON) 15 MG tablet Take 15 mg by mouth at bedtime.   Yes Historical Provider, MD  Multiple Vitamin (MULTIVITAMIN WITH MINERALS) TABS tablet Take 1 tablet by mouth daily.   Yes Historical Provider, MD  oxyCODONE (OXY IR/ROXICODONE) 5 MG immediate release tablet Take 1 tablet (5 mg total) by mouth every 4 (four) hours as needed for moderate pain. 03/18/15  Yes Penny Piarlando Vega, MD  polyethylene glycol (MIRALAX / GLYCOLAX) packet Take 17 g by mouth daily.   Yes Historical Provider, MD  sertraline (ZOLOFT) 25 MG tablet Take 25 mg by mouth daily.  04/11/15  Yes Historical Provider, MD  docusate sodium (COLACE) 100 MG capsule Take 1 capsule (100 mg total) by mouth 2 (two) times daily. Patient not taking: Reported on 05/19/2015 03/18/15   Penny Piarlando Vega, MD   BP 109/66 mmHg  Pulse 67  Temp(Src) 97.5 F (36.4 C) (Oral)  Resp 12  SpO2 100% Physical Exam  Constitutional: He is oriented to person, place, and time. He appears well-developed and well-nourished. No distress.  HENT:  Head: Normocephalic and atraumatic.  Eyes: Conjunctivae are normal.  Neck: Neck supple. No tracheal deviation present.  Cardiovascular: Normal rate and  regular rhythm.   Pulmonary/Chest: Effort normal. No respiratory distress.  Abdominal: Soft. He exhibits no distension.  Neurological: He is alert and oriented to person, place, and time.  Skin: Skin is warm and dry.  Left AKA wound dehiscence and exposed subcutaneous tissue with foul smelling drainage. No induration, fluctuance, or extending erythema  Psychiatric: He has a normal mood and affect.    ED Course  Procedures (including critical care time) Labs Review Labs Reviewed  CBC WITH DIFFERENTIAL/PLATELET   BASIC METABOLIC PANEL  SEDIMENTATION RATE    Imaging Review No results found. I have personally reviewed and evaluated these images and lab results as part of my medical decision-making.   EKG Interpretation None      MDM   Final diagnoses:  Leg wound, left, initial encounter    74 year old male with history of left AKA 2 months ago presents with wound to stump. He was last seen one month ago in clinic where wound was closed, well-healed, and no signs of infection. Today he has foul-smelling drainage from a gaping 8 cm wound that is overlying surgical incision. There is no extending erythema, fluctuance, induration to suggest deeper infection currently and patient has no fever or other signs of impending sepsis. I'm concerned for local wound infection with lack of routine maintenance on this area. Wound/ostomy consult was placed for evaluation. Vascular surgeon on call for Dr. Imogene Burn who performed the surgery initially were made aware of situation and offered to see the patient in the emergency department in consultation. Disposition per their recommendations, likely dc.     Lyndal Pulley, MD 05/20/15 [phone]

## 2015-05-20 ENCOUNTER — Telehealth: Payer: Self-pay | Admitting: Vascular Surgery

## 2015-05-20 NOTE — Telephone Encounter (Signed)
LM for pt re appt, dpm °

## 2015-05-20 NOTE — Telephone Encounter (Signed)
-----   Message from Phillips Odorarol S Pullins, RN sent at 05/19/2015  4:36 PM EST ----- Regarding: needs 1-2 wk. f/u with [facility]; f/u from ER   ----- Message -----    From: Dara LordsSamantha J Rhyne, PA-C    Sent: 05/19/2015   3:47 PM      To: Vvs Charge Pool  S/p left AKA 03/16/15 & staples out on 04/22/15.  Pt in ER today with wound on stump.  Needs to see Imogene BurnChen in 1-2 weeks.

## 2015-05-28 ENCOUNTER — Encounter (HOSPITAL_BASED_OUTPATIENT_CLINIC_OR_DEPARTMENT_OTHER): Payer: Medicare Other | Attending: Internal Medicine

## 2015-05-28 DIAGNOSIS — Z89612 Acquired absence of left leg above knee: Secondary | ICD-10-CM | POA: Diagnosis not present

## 2015-05-28 DIAGNOSIS — T8781 Dehiscence of amputation stump: Secondary | ICD-10-CM | POA: Insufficient documentation

## 2015-05-28 DIAGNOSIS — I10 Essential (primary) hypertension: Secondary | ICD-10-CM | POA: Diagnosis not present

## 2015-05-28 DIAGNOSIS — H409 Unspecified glaucoma: Secondary | ICD-10-CM | POA: Insufficient documentation

## 2015-05-28 DIAGNOSIS — E1151 Type 2 diabetes mellitus with diabetic peripheral angiopathy without gangrene: Secondary | ICD-10-CM | POA: Insufficient documentation

## 2015-05-28 DIAGNOSIS — F17200 Nicotine dependence, unspecified, uncomplicated: Secondary | ICD-10-CM | POA: Insufficient documentation

## 2015-05-28 DIAGNOSIS — Z7984 Long term (current) use of oral hypoglycemic drugs: Secondary | ICD-10-CM | POA: Insufficient documentation

## 2015-05-29 ENCOUNTER — Encounter: Payer: Self-pay | Admitting: Vascular Surgery

## 2015-06-05 ENCOUNTER — Encounter: Payer: Self-pay | Admitting: Vascular Surgery

## 2015-10-30 ENCOUNTER — Ambulatory Visit: Payer: Self-pay | Admitting: Vascular Surgery

## 2019-03-28 DEATH — deceased
